# Patient Record
Sex: Male | Born: 1935 | Race: Asian | Hispanic: No | Marital: Married | Smoking: Never smoker
Health system: Southern US, Community
[De-identification: ages and names within clinical notes are randomized; demographics above are authoritative.]

## PROBLEM LIST (undated history)

## (undated) DIAGNOSIS — K219 Gastro-esophageal reflux disease without esophagitis: Secondary | ICD-10-CM

## (undated) DIAGNOSIS — Z860100 Personal history of colon polyps, unspecified: Secondary | ICD-10-CM

## (undated) DIAGNOSIS — I2510 Atherosclerotic heart disease of native coronary artery without angina pectoris: Secondary | ICD-10-CM

## (undated) DIAGNOSIS — N400 Enlarged prostate without lower urinary tract symptoms: Secondary | ICD-10-CM

## (undated) DIAGNOSIS — R350 Frequency of micturition: Secondary | ICD-10-CM

## (undated) DIAGNOSIS — E785 Hyperlipidemia, unspecified: Secondary | ICD-10-CM

## (undated) DIAGNOSIS — F419 Anxiety disorder, unspecified: Secondary | ICD-10-CM

## (undated) DIAGNOSIS — I4901 Ventricular fibrillation: Secondary | ICD-10-CM

## (undated) DIAGNOSIS — M1711 Unilateral primary osteoarthritis, right knee: Secondary | ICD-10-CM

## (undated) DIAGNOSIS — I10 Essential (primary) hypertension: Secondary | ICD-10-CM

## (undated) HISTORY — DX: Personal history of colonic polyps: Z86.010

## (undated) HISTORY — PX: CORONARY ANGIOPLASTY: SHX604

## (undated) HISTORY — DX: Ventricular fibrillation: I49.01

## (undated) HISTORY — DX: Hyperlipidemia, unspecified: E78.5

## (undated) HISTORY — DX: Atherosclerotic heart disease of native coronary artery without angina pectoris: I25.10

## (undated) HISTORY — DX: Benign prostatic hyperplasia without lower urinary tract symptoms: N40.0

## (undated) HISTORY — DX: Gastro-esophageal reflux disease without esophagitis: K21.9

## (undated) HISTORY — DX: Unilateral primary osteoarthritis, right knee: M17.11

## (undated) HISTORY — PX: FOOT SURGERY: SHX648

## (undated) HISTORY — DX: Personal history of colon polyps, unspecified: Z86.0100

---

## 1957-11-12 HISTORY — PX: OTHER SURGICAL HISTORY: SHX169

## 1999-11-13 HISTORY — PX: CORONARY ARTERY BYPASS GRAFT: SHX141

## 2000-05-31 ENCOUNTER — Ambulatory Visit (HOSPITAL_COMMUNITY): Admission: RE | Admit: 2000-05-31 | Discharge: 2000-05-31 | Payer: Self-pay | Admitting: Cardiovascular Disease

## 2000-06-12 ENCOUNTER — Encounter: Payer: Self-pay | Admitting: Surgery

## 2000-06-17 ENCOUNTER — Inpatient Hospital Stay (HOSPITAL_COMMUNITY): Admission: RE | Admit: 2000-06-17 | Discharge: 2000-06-22 | Payer: Self-pay | Admitting: Surgery

## 2000-06-17 ENCOUNTER — Encounter: Payer: Self-pay | Admitting: Surgery

## 2000-06-18 ENCOUNTER — Encounter: Payer: Self-pay | Admitting: Surgery

## 2000-06-19 ENCOUNTER — Encounter: Payer: Self-pay | Admitting: Surgery

## 2002-10-06 ENCOUNTER — Encounter: Payer: Self-pay | Admitting: Cardiovascular Disease

## 2004-05-31 ENCOUNTER — Inpatient Hospital Stay (HOSPITAL_COMMUNITY): Admission: AD | Admit: 2004-05-31 | Discharge: 2004-06-01 | Payer: Self-pay | Admitting: Cardiovascular Disease

## 2004-11-02 ENCOUNTER — Ambulatory Visit: Payer: Self-pay | Admitting: Internal Medicine

## 2004-11-14 ENCOUNTER — Ambulatory Visit: Payer: Self-pay | Admitting: Internal Medicine

## 2004-11-14 ENCOUNTER — Encounter: Admission: RE | Admit: 2004-11-14 | Discharge: 2004-11-14 | Payer: Self-pay | Admitting: Internal Medicine

## 2005-01-26 ENCOUNTER — Ambulatory Visit: Payer: Self-pay | Admitting: Internal Medicine

## 2005-02-02 ENCOUNTER — Ambulatory Visit: Payer: Self-pay | Admitting: Internal Medicine

## 2005-02-09 ENCOUNTER — Ambulatory Visit (HOSPITAL_COMMUNITY): Admission: RE | Admit: 2005-02-09 | Discharge: 2005-02-09 | Payer: Self-pay | Admitting: Internal Medicine

## 2005-06-26 ENCOUNTER — Ambulatory Visit: Payer: Self-pay | Admitting: Internal Medicine

## 2005-06-29 ENCOUNTER — Ambulatory Visit: Payer: Self-pay | Admitting: Internal Medicine

## 2005-09-13 ENCOUNTER — Ambulatory Visit: Payer: Self-pay | Admitting: Internal Medicine

## 2005-10-05 ENCOUNTER — Emergency Department (HOSPITAL_COMMUNITY): Admission: EM | Admit: 2005-10-05 | Discharge: 2005-10-05 | Payer: Self-pay | Admitting: Emergency Medicine

## 2005-10-12 ENCOUNTER — Ambulatory Visit: Payer: Self-pay | Admitting: Internal Medicine

## 2006-01-11 ENCOUNTER — Ambulatory Visit: Payer: Self-pay | Admitting: Internal Medicine

## 2006-01-18 ENCOUNTER — Ambulatory Visit: Payer: Self-pay | Admitting: Internal Medicine

## 2006-01-28 ENCOUNTER — Ambulatory Visit: Payer: Self-pay | Admitting: Internal Medicine

## 2006-02-04 ENCOUNTER — Ambulatory Visit: Payer: Self-pay | Admitting: Internal Medicine

## 2006-03-07 ENCOUNTER — Ambulatory Visit: Payer: Self-pay | Admitting: Internal Medicine

## 2006-03-07 ENCOUNTER — Encounter (INDEPENDENT_AMBULATORY_CARE_PROVIDER_SITE_OTHER): Payer: Self-pay | Admitting: *Deleted

## 2006-09-13 ENCOUNTER — Ambulatory Visit: Payer: Self-pay | Admitting: Internal Medicine

## 2007-01-23 ENCOUNTER — Ambulatory Visit: Payer: Self-pay | Admitting: Internal Medicine

## 2007-01-23 LAB — CONVERTED CEMR LAB
ALT: 19 units/L (ref 0–40)
Alkaline Phosphatase: 71 units/L (ref 39–117)
Basophils Absolute: 0 10*3/uL (ref 0.0–0.1)
Basophils Relative: 0.2 % (ref 0.0–1.0)
Bilirubin Urine: NEGATIVE
Bilirubin, Direct: 0.2 mg/dL (ref 0.0–0.3)
CO2: 28 meq/L (ref 19–32)
Cholesterol: 145 mg/dL (ref 0–200)
Creatinine, Ser: 1.1 mg/dL (ref 0.4–1.5)
Eosinophils Absolute: 0.2 10*3/uL (ref 0.0–0.6)
Eosinophils Relative: 1.6 % (ref 0.0–5.0)
GFR calc Af Amer: 85 mL/min
Glucose, Bld: 100 mg/dL — ABNORMAL HIGH (ref 70–99)
HDL: 55.7 mg/dL (ref >39.0)
Ketones, ur: NEGATIVE mg/dL
Leukocytes, UA: NEGATIVE
Lymphocytes Relative: 35 % (ref 12.0–46.0)
MCHC: 34 g/dL (ref 30.0–36.0)
MCV: 88.7 fL (ref 78.0–100.0)
Neutro Abs: 5.7 10*3/uL (ref 1.4–7.7)
Nitrite: NEGATIVE
PSA: 1.66 ng/mL
PSA: 1.66 ng/mL (ref 0.10–4.00)
Specific Gravity, Urine: 1.025 (ref 1.000–1.03)
TSH: 1.33 microintl units/mL (ref 0.35–5.50)
Total Bilirubin: 1 mg/dL (ref 0.3–1.2)
Total Protein, Urine: NEGATIVE mg/dL
Triglycerides: 63 mg/dL (ref 0–149)
pH: 5.5 (ref 5.0–8.0)

## 2007-04-23 ENCOUNTER — Ambulatory Visit: Payer: Self-pay | Admitting: Internal Medicine

## 2007-04-28 ENCOUNTER — Encounter: Payer: Self-pay | Admitting: Cardiovascular Disease

## 2007-05-13 HISTORY — PX: OTHER SURGICAL HISTORY: SHX169

## 2007-05-15 ENCOUNTER — Encounter: Payer: Self-pay | Admitting: Urology

## 2007-05-16 ENCOUNTER — Inpatient Hospital Stay (HOSPITAL_COMMUNITY): Admission: RE | Admit: 2007-05-16 | Discharge: 2007-05-17 | Payer: Self-pay | Admitting: Urology

## 2007-05-17 ENCOUNTER — Emergency Department (HOSPITAL_COMMUNITY): Admission: EM | Admit: 2007-05-17 | Discharge: 2007-05-17 | Payer: Self-pay | Admitting: Emergency Medicine

## 2007-08-04 ENCOUNTER — Encounter: Payer: Self-pay | Admitting: Internal Medicine

## 2007-08-04 DIAGNOSIS — K219 Gastro-esophageal reflux disease without esophagitis: Secondary | ICD-10-CM

## 2007-08-04 DIAGNOSIS — K4090 Unilateral inguinal hernia, without obstruction or gangrene, not specified as recurrent: Secondary | ICD-10-CM | POA: Insufficient documentation

## 2007-08-04 DIAGNOSIS — M1990 Unspecified osteoarthritis, unspecified site: Secondary | ICD-10-CM | POA: Insufficient documentation

## 2007-09-10 ENCOUNTER — Ambulatory Visit: Payer: Self-pay | Admitting: Internal Medicine

## 2007-09-12 ENCOUNTER — Encounter: Payer: Self-pay | Admitting: Internal Medicine

## 2008-03-15 ENCOUNTER — Ambulatory Visit: Payer: Self-pay | Admitting: Internal Medicine

## 2008-03-15 LAB — CONVERTED CEMR LAB
AST: 22 units/L (ref 0–37)
Albumin: 3.7 g/dL (ref 3.5–5.2)
Alkaline Phosphatase: 67 units/L (ref 39–117)
BUN: 20 mg/dL (ref 6–23)
Basophils Absolute: 0 10*3/uL (ref 0.0–0.1)
Bilirubin Urine: NEGATIVE
Bilirubin, Direct: 0.1 mg/dL (ref 0.0–0.3)
CO2: 27 meq/L (ref 19–32)
Calcium: 9.2 mg/dL (ref 8.4–10.5)
Cholesterol: 194 mg/dL (ref 0–200)
Creatinine, Ser: 1.1 mg/dL (ref 0.4–1.5)
GFR calc non Af Amer: 70 mL/min
Glucose, Bld: 100 mg/dL — ABNORMAL HIGH (ref 70–99)
HDL: 52.3 mg/dL (ref >39.0)
Hemoglobin, Urine: NEGATIVE
Hemoglobin: 13.5 g/dL (ref 13.0–17.0)
Leukocytes, UA: NEGATIVE
MCHC: 33.8 g/dL (ref 30.0–36.0)
Neutro Abs: 4.2 10*3/uL (ref 1.4–7.7)
Nitrite: NEGATIVE
PSA: 0.92 ng/mL (ref 0.10–4.00)
Platelets: 180 10*3/uL (ref 150–400)
Potassium: 4 meq/L (ref 3.5–5.1)
RBC: 4.46 M/uL (ref 4.22–5.81)
RDW: 13.5 % (ref 11.5–14.6)
Sodium: 144 meq/L (ref 135–145)
Total Bilirubin: 0.9 mg/dL (ref 0.3–1.2)
Total Protein, Urine: NEGATIVE mg/dL
WBC: 8.5 10*3/uL (ref 4.5–10.5)

## 2008-03-24 ENCOUNTER — Ambulatory Visit: Payer: Self-pay | Admitting: Internal Medicine

## 2008-03-24 DIAGNOSIS — Z860100 Personal history of colon polyps, unspecified: Secondary | ICD-10-CM | POA: Insufficient documentation

## 2008-03-24 DIAGNOSIS — N400 Enlarged prostate without lower urinary tract symptoms: Secondary | ICD-10-CM

## 2008-03-24 DIAGNOSIS — E785 Hyperlipidemia, unspecified: Secondary | ICD-10-CM

## 2008-04-12 DIAGNOSIS — I4901 Ventricular fibrillation: Secondary | ICD-10-CM

## 2008-04-12 HISTORY — DX: Ventricular fibrillation: I49.01

## 2008-04-15 ENCOUNTER — Inpatient Hospital Stay (HOSPITAL_COMMUNITY): Admission: EM | Admit: 2008-04-15 | Discharge: 2008-04-20 | Payer: Self-pay | Admitting: Emergency Medicine

## 2008-04-15 ENCOUNTER — Ambulatory Visit: Payer: Self-pay | Admitting: Internal Medicine

## 2008-05-05 ENCOUNTER — Ambulatory Visit: Payer: Self-pay

## 2008-07-20 ENCOUNTER — Ambulatory Visit: Payer: Self-pay | Admitting: Internal Medicine

## 2008-08-21 IMAGING — CR DG CHEST 2V
2 series · 2 of 2 positions shown · non-contrast
Comparison: 01/28/2006.

Exam: Chest, 2 views.

HISTORY: Preop radiograph. BPH.

[w chest pa]
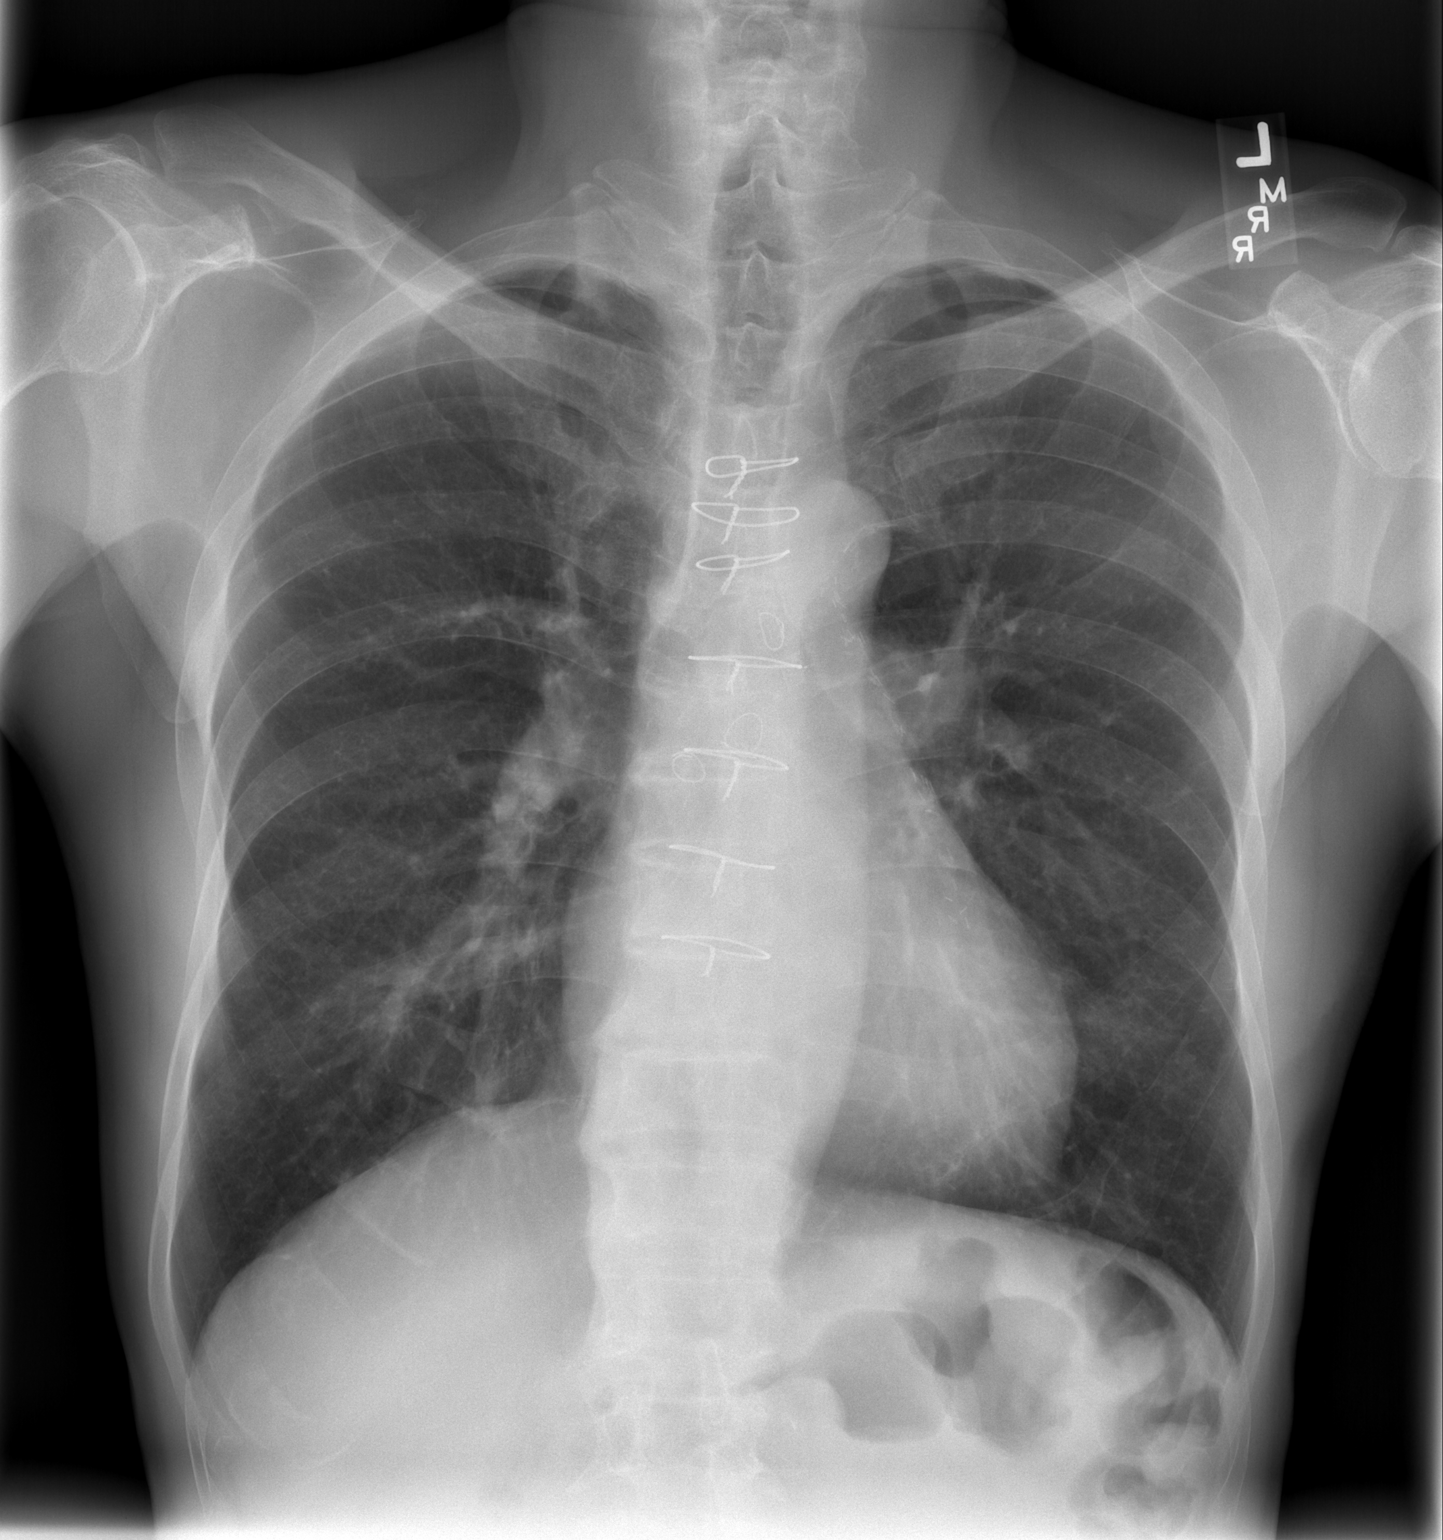

[w chest lat]
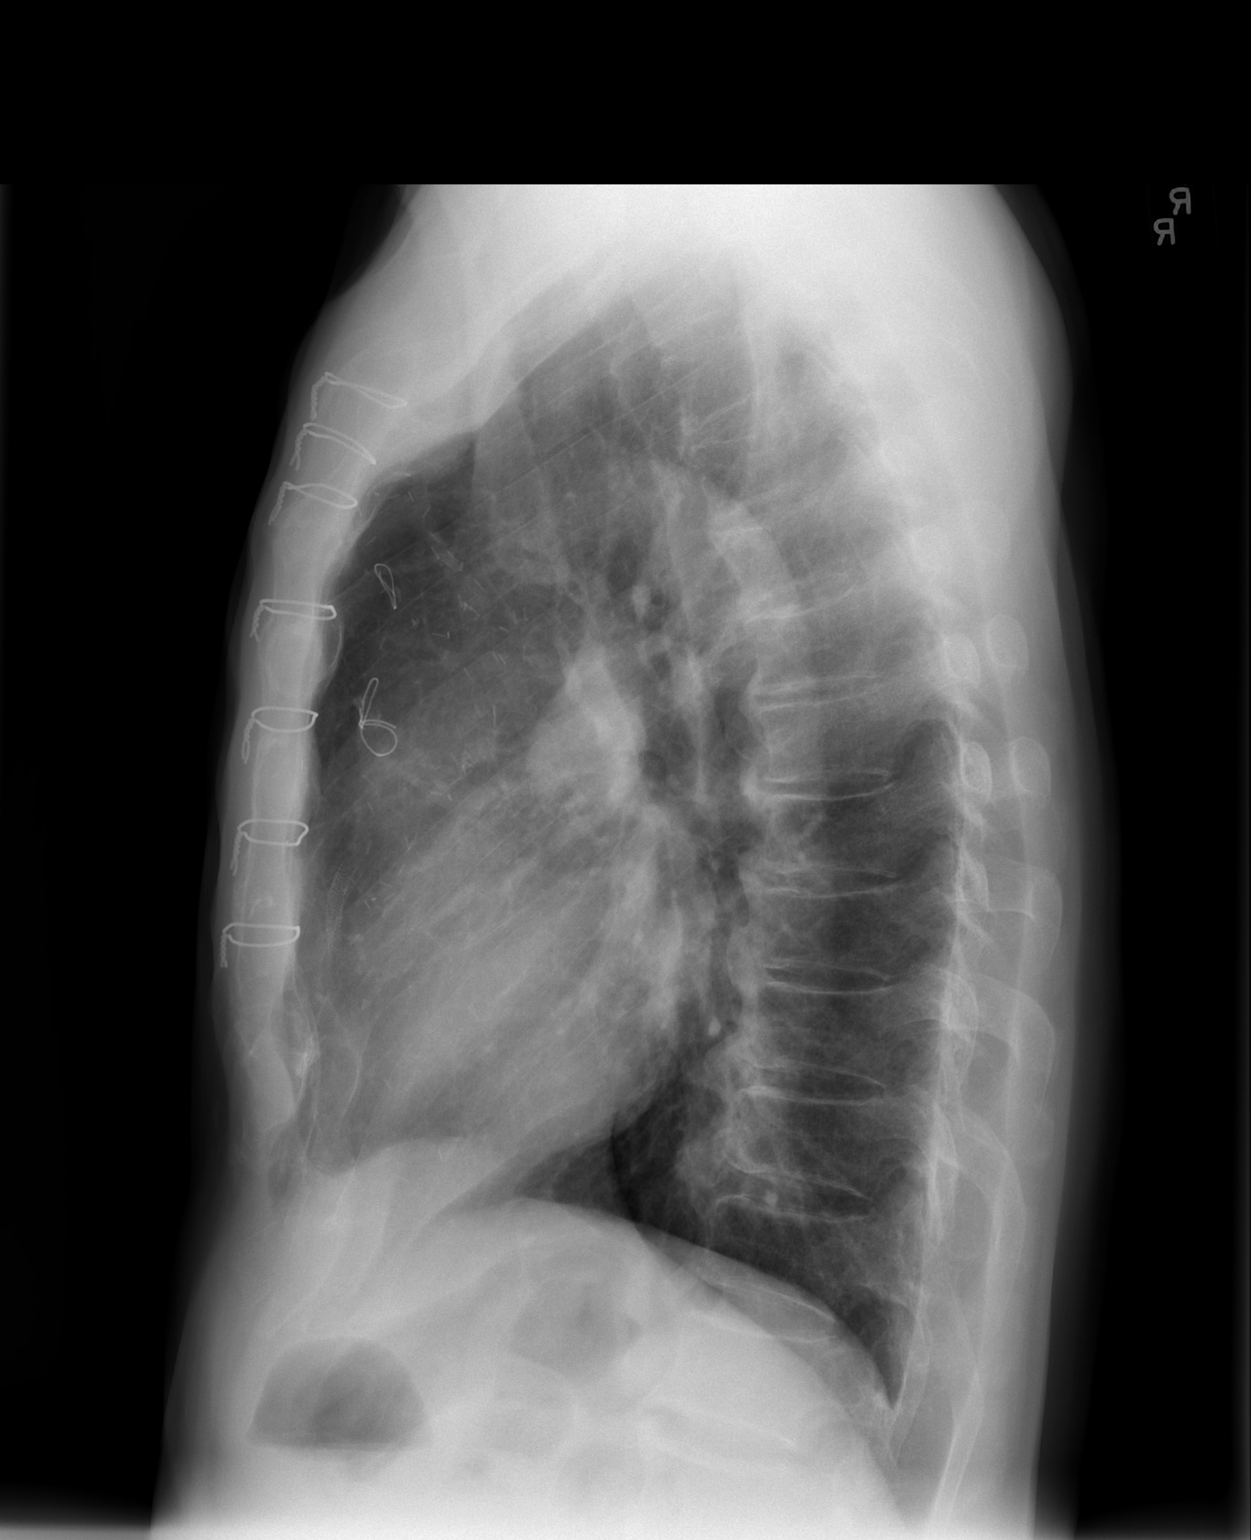

[2 of 2 positions shown; findings below may reference images not displayed]

FINDINGS: Heart size is normal. 

There is no effusions or edema.

Interstitial change consistent with COPD/emphysema.
IMPRESSION: 1. No active disease.
2. COPD/emphysema.

## 2008-08-24 ENCOUNTER — Ambulatory Visit: Payer: Self-pay | Admitting: Internal Medicine

## 2008-10-19 ENCOUNTER — Ambulatory Visit: Payer: Self-pay | Admitting: Internal Medicine

## 2009-01-18 ENCOUNTER — Ambulatory Visit: Payer: Self-pay | Admitting: Internal Medicine

## 2009-02-08 ENCOUNTER — Encounter: Payer: Self-pay | Admitting: Internal Medicine

## 2009-03-14 ENCOUNTER — Ambulatory Visit: Payer: Self-pay | Admitting: Internal Medicine

## 2009-03-14 LAB — CONVERTED CEMR LAB
ALT: 18 units/L (ref 0–53)
AST: 20 units/L (ref 0–37)
Albumin: 3.9 g/dL (ref 3.5–5.2)
Alkaline Phosphatase: 63 units/L (ref 39–117)
BUN: 12 mg/dL (ref 6–23)
Basophils Absolute: 0 10*3/uL (ref 0.0–0.1)
Basophils Relative: 0.3 % (ref 0.0–3.0)
Bilirubin Urine: NEGATIVE
Bilirubin, Direct: 0.2 mg/dL (ref 0.0–0.3)
CO2: 29 meq/L (ref 19–32)
Calcium: 9.3 mg/dL (ref 8.4–10.5)
Chloride: 109 meq/L (ref 96–112)
Cholesterol: 125 mg/dL (ref 0–200)
Creatinine, Ser: 1.1 mg/dL (ref 0.4–1.5)
Eosinophils Absolute: 0.2 10*3/uL (ref 0.0–0.7)
Eosinophils Relative: 2.4 % (ref 0.0–5.0)
GFR calc non Af Amer: 69.97 mL/min (ref >60)
Glucose, Bld: 90 mg/dL (ref 70–99)
HCT: 42.3 % (ref 39.0–52.0)
HDL: 42.9 mg/dL (ref >39.00)
Hemoglobin: 14.4 g/dL (ref 13.0–17.0)
Ketones, ur: NEGATIVE mg/dL
LDL Cholesterol: 67 mg/dL (ref 0–99)
Leukocytes, UA: NEGATIVE
Lymphocytes Relative: 35.3 % (ref 12.0–46.0)
Lymphs Abs: 3.1 10*3/uL (ref 0.7–4.0)
MCHC: 34.1 g/dL (ref 30.0–36.0)
MCV: 89.4 fL (ref 78.0–100.0)
Monocytes Absolute: 0.7 10*3/uL (ref 0.1–1.0)
Monocytes Relative: 7.9 % (ref 3.0–12.0)
Neutro Abs: 4.7 10*3/uL (ref 1.4–7.7)
Neutrophils Relative %: 54.1 % (ref 43.0–77.0)
Nitrite: NEGATIVE
PSA: 1.3 ng/mL (ref 0.10–4.00)
Platelets: 137 10*3/uL — ABNORMAL LOW (ref 150.0–400.0)
Potassium: 4.3 meq/L (ref 3.5–5.1)
RBC: 4.73 M/uL (ref 4.22–5.81)
RDW: 13 % (ref 11.5–14.6)
Sodium: 142 meq/L (ref 135–145)
Specific Gravity, Urine: 1.02 (ref 1.000–1.030)
TSH: 1.76 microintl units/mL (ref 0.35–5.50)
Total Bilirubin: 1.1 mg/dL (ref 0.3–1.2)
Total CHOL/HDL Ratio: 3
Total Protein, Urine: NEGATIVE mg/dL
Total Protein: 7.4 g/dL (ref 6.0–8.3)
Triglycerides: 74 mg/dL (ref 0.0–149.0)
Urine Glucose: NEGATIVE mg/dL
Urobilinogen, UA: 0.2 (ref 0.0–1.0)
VLDL: 14.8 mg/dL (ref 0.0–40.0)
WBC: 8.7 10*3/uL (ref 4.5–10.5)
pH: 5.5 (ref 5.0–8.0)

## 2009-03-25 ENCOUNTER — Ambulatory Visit: Payer: Self-pay | Admitting: Internal Medicine

## 2009-04-05 ENCOUNTER — Ambulatory Visit: Payer: Self-pay | Admitting: Internal Medicine

## 2009-04-05 DIAGNOSIS — I4901 Ventricular fibrillation: Secondary | ICD-10-CM

## 2009-05-07 ENCOUNTER — Encounter: Payer: Self-pay | Admitting: Internal Medicine

## 2009-06-15 ENCOUNTER — Ambulatory Visit: Payer: Self-pay | Admitting: Internal Medicine

## 2009-07-05 ENCOUNTER — Encounter: Payer: Self-pay | Admitting: Internal Medicine

## 2009-07-05 ENCOUNTER — Ambulatory Visit: Payer: Self-pay | Admitting: Internal Medicine

## 2009-07-20 ENCOUNTER — Encounter: Payer: Self-pay | Admitting: Internal Medicine

## 2009-07-24 IMAGING — CR DG CHEST 1V PORT
1 series · 1 of 1 positions shown · non-contrast
Comparison: 05/14/2007

CLINICAL DATA: Chest pain

PORTABLE CHEST - 1 VIEW

[AP]
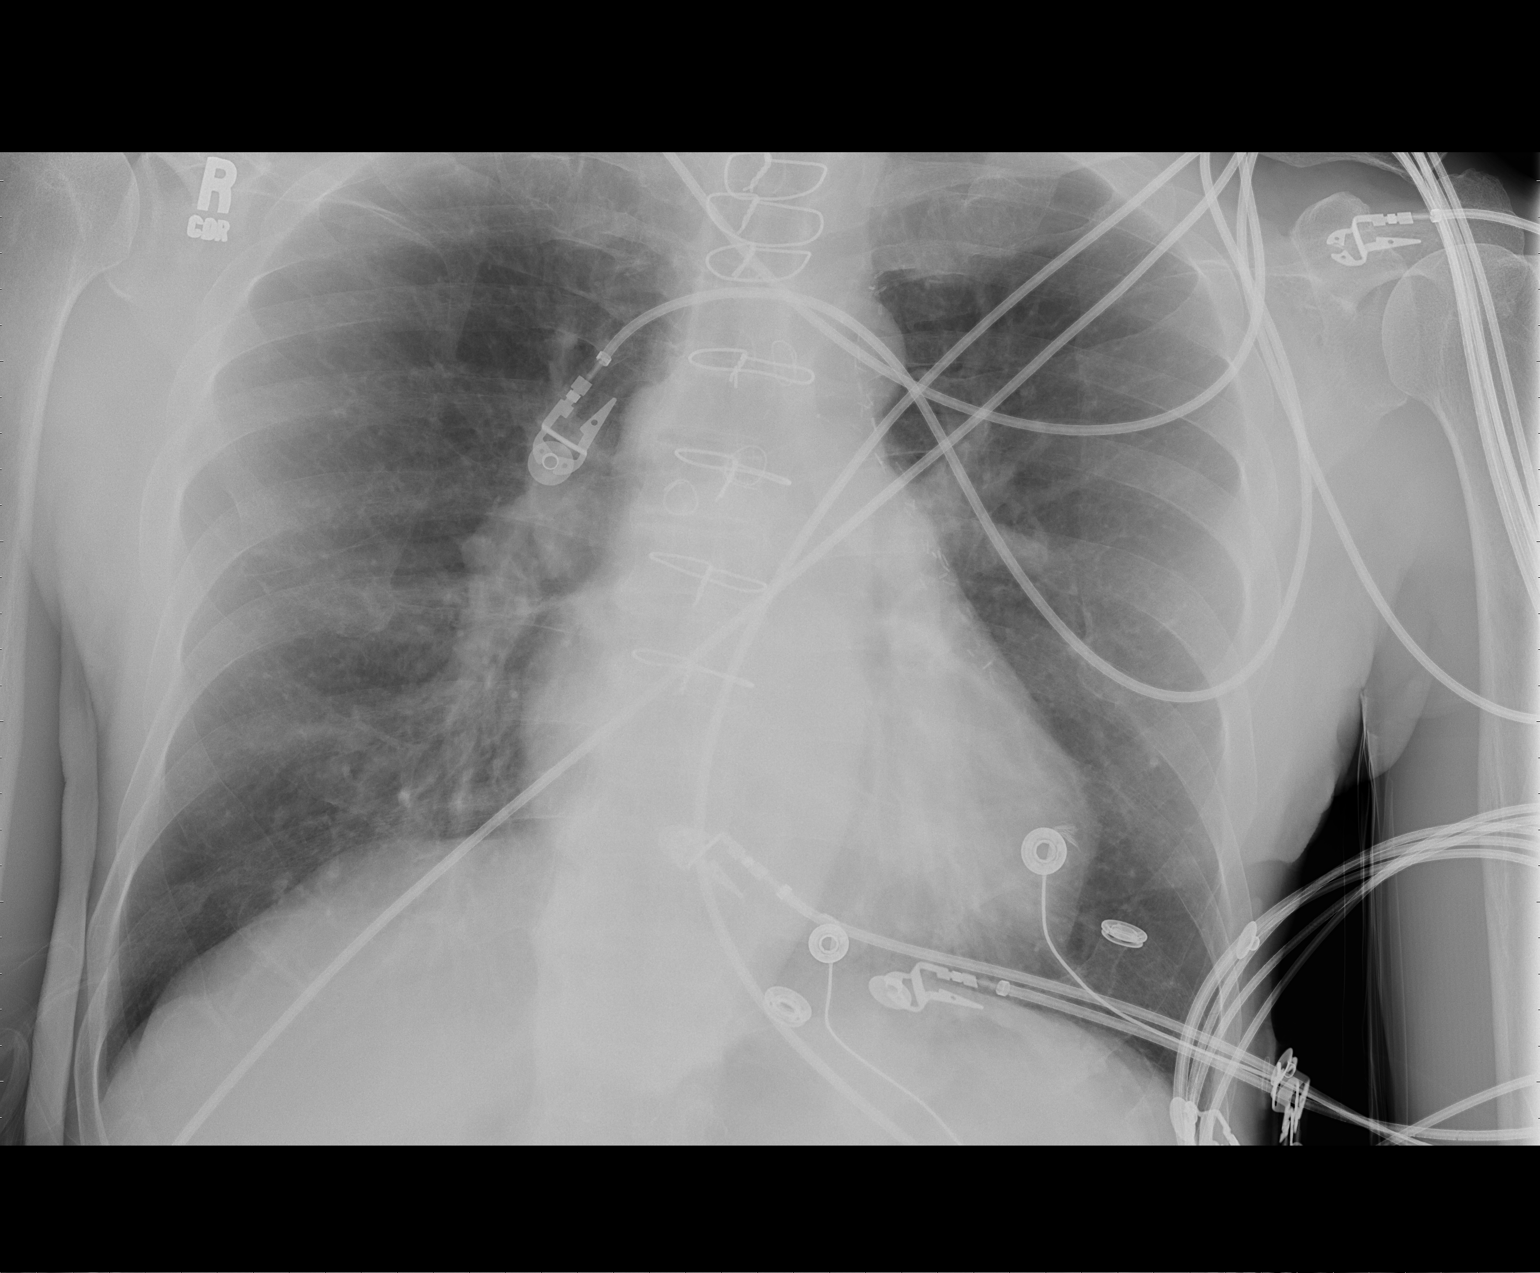

[1 of 1 positions shown; findings below may reference images not displayed]

FINDINGS: COPD with hyperinflation.  Cardiomegaly status post CABG.
No infiltrates, failure, or pneumothorax.  Little change from
priors.
IMPRESSION: COPD, no active disease, and no significant change.

## 2009-08-11 ENCOUNTER — Encounter: Payer: Self-pay | Admitting: Internal Medicine

## 2009-09-12 ENCOUNTER — Telehealth: Payer: Self-pay | Admitting: Internal Medicine

## 2009-09-21 ENCOUNTER — Ambulatory Visit: Payer: Self-pay | Admitting: Internal Medicine

## 2009-09-21 DIAGNOSIS — K4021 Bilateral inguinal hernia, without obstruction or gangrene, recurrent: Secondary | ICD-10-CM | POA: Insufficient documentation

## 2009-09-21 DIAGNOSIS — R109 Unspecified abdominal pain: Secondary | ICD-10-CM | POA: Insufficient documentation

## 2009-09-21 LAB — CONVERTED CEMR LAB
Nitrite: NEGATIVE
Protein, U semiquant: NEGATIVE
pH: 5

## 2009-10-04 ENCOUNTER — Telehealth (INDEPENDENT_AMBULATORY_CARE_PROVIDER_SITE_OTHER): Payer: Self-pay | Admitting: *Deleted

## 2009-10-04 ENCOUNTER — Ambulatory Visit: Payer: Self-pay | Admitting: Internal Medicine

## 2009-10-04 DIAGNOSIS — I4949 Other premature depolarization: Secondary | ICD-10-CM

## 2009-10-05 ENCOUNTER — Telehealth (INDEPENDENT_AMBULATORY_CARE_PROVIDER_SITE_OTHER): Payer: Self-pay | Admitting: *Deleted

## 2009-10-10 ENCOUNTER — Telehealth (INDEPENDENT_AMBULATORY_CARE_PROVIDER_SITE_OTHER): Payer: Self-pay | Admitting: *Deleted

## 2009-10-12 ENCOUNTER — Telehealth: Payer: Self-pay | Admitting: Internal Medicine

## 2009-10-18 ENCOUNTER — Telehealth: Payer: Self-pay | Admitting: Internal Medicine

## 2009-10-19 ENCOUNTER — Encounter: Payer: Self-pay | Admitting: Internal Medicine

## 2009-11-15 ENCOUNTER — Ambulatory Visit: Payer: Self-pay | Admitting: Cardiology

## 2009-11-15 DIAGNOSIS — I429 Cardiomyopathy, unspecified: Secondary | ICD-10-CM | POA: Insufficient documentation

## 2009-11-15 DIAGNOSIS — I2589 Other forms of chronic ischemic heart disease: Secondary | ICD-10-CM

## 2009-11-17 ENCOUNTER — Encounter (INDEPENDENT_AMBULATORY_CARE_PROVIDER_SITE_OTHER): Payer: Self-pay | Admitting: *Deleted

## 2009-11-17 ENCOUNTER — Ambulatory Visit: Payer: Self-pay | Admitting: Cardiology

## 2009-11-17 LAB — CONVERTED CEMR LAB
ALT: 19 units/L (ref 0–53)
AST: 21 units/L (ref 0–37)
Albumin: 3.6 g/dL (ref 3.5–5.2)
Alkaline Phosphatase: 59 units/L (ref 39–117)
Bilirubin, Direct: 0.1 mg/dL (ref 0.0–0.3)
Cholesterol: 147 mg/dL (ref 0–200)
LDL Cholesterol: 83 mg/dL (ref 0–99)
Total CHOL/HDL Ratio: 3
Total Protein: 7.2 g/dL (ref 6.0–8.3)
Triglycerides: 97 mg/dL (ref 0.0–149.0)
VLDL: 19.4 mg/dL (ref 0.0–40.0)

## 2009-11-21 ENCOUNTER — Encounter: Payer: Self-pay | Admitting: Cardiology

## 2009-11-21 ENCOUNTER — Ambulatory Visit: Payer: Self-pay | Admitting: Cardiology

## 2009-11-21 ENCOUNTER — Ambulatory Visit (HOSPITAL_COMMUNITY): Admission: RE | Admit: 2009-11-21 | Discharge: 2009-11-21 | Payer: Self-pay | Admitting: Cardiology

## 2009-11-21 ENCOUNTER — Ambulatory Visit: Payer: Self-pay

## 2009-11-22 ENCOUNTER — Ambulatory Visit (HOSPITAL_COMMUNITY): Admission: RE | Admit: 2009-11-22 | Discharge: 2009-11-23 | Payer: Self-pay | Admitting: Surgery

## 2009-11-25 ENCOUNTER — Encounter: Payer: Self-pay | Admitting: Internal Medicine

## 2009-12-12 ENCOUNTER — Encounter: Payer: Self-pay | Admitting: Cardiology

## 2009-12-13 ENCOUNTER — Ambulatory Visit: Payer: Self-pay | Admitting: Cardiology

## 2009-12-14 ENCOUNTER — Telehealth (INDEPENDENT_AMBULATORY_CARE_PROVIDER_SITE_OTHER): Payer: Self-pay | Admitting: *Deleted

## 2010-01-03 ENCOUNTER — Ambulatory Visit: Payer: Self-pay | Admitting: Internal Medicine

## 2010-01-03 ENCOUNTER — Encounter: Payer: Self-pay | Admitting: Internal Medicine

## 2010-01-06 ENCOUNTER — Encounter: Payer: Self-pay | Admitting: Internal Medicine

## 2010-01-16 ENCOUNTER — Encounter: Payer: Self-pay | Admitting: Internal Medicine

## 2010-03-28 ENCOUNTER — Telehealth: Payer: Self-pay | Admitting: Cardiology

## 2010-03-28 ENCOUNTER — Encounter: Payer: Self-pay | Admitting: Internal Medicine

## 2010-03-28 ENCOUNTER — Telehealth (INDEPENDENT_AMBULATORY_CARE_PROVIDER_SITE_OTHER): Payer: Self-pay | Admitting: *Deleted

## 2010-12-10 LAB — CONVERTED CEMR LAB
Creatinine, Ser: 1.2 mg/dL (ref 0.4–1.5)
GFR calc non Af Amer: 63.19 mL/min (ref >60)
Glucose, Bld: 111 mg/dL — ABNORMAL HIGH (ref 70–99)

## 2010-12-12 NOTE — Letter (Signed)
Summary: [facility]   Imported By: Sherian Rein 01/13/2010 12:26:34  _____________________________________________________________________  External Attachment:    Type:   Image     Comment:   External Document

## 2010-12-12 NOTE — Letter (Signed)
Summary: Remote Device Check  [employer], Main Office  [address]   [place], [place] [address]   Phone: [phone]  Fax: [phone]     January 16, 2010 MRN: [id number]   LIDO MASKE [address] [place], [place]  [address]   Dear Mr. RODRIGUE,   Your remote transmission was recieved and reviewed by your physician.  All diagnostics were within normal limits for you.  __X___Your next transmission is scheduled for:      Apr 04, 2010.  Please transmit at any time this day.  If you have a wireless device your transmission will be sent automatically.     Sincerely,  Proofreader

## 2010-12-12 NOTE — Miscellaneous (Signed)
Summary: DEVICE  UPDATE  Clinical Lists Changes  Observations: Added new observation of [employer]: [employer], 03-28-10 PER DAUGHTER- PT MOVED OUT OF THE COUNTRY (03/28/2010 9:58) Added new observation of ICD MD: Inactive (03/28/2010 9:58)       ICD Specifications Following MD:  Inactive     ICD Vendor:  Medtronic     ICD Model Number:  D284VRC     ICD Serial Number:  [id number] ICD DOI:  04/19/2008     ICD Implanting MD:  [name], MD  Lead 1:    Location: RV     DOI: 04/19/2008     Model #: [id number]     Serial #: [id number]     Status: active  Indications::  ICM  Explantation Comments: [employer], 03-28-10 PER DAUGHTER- PT MOVED OUT OF THE COUNTRY  ICD Follow Up ICD Dependent:  No      Episodes Coumadin:  No  Brady Parameters Mode VVI     Lower Rate Limit:  40      Tachy Zones VF:  200     VT1:  171

## 2010-12-12 NOTE — Progress Notes (Signed)
 ----   Converted from flag ---- ---- 03/28/2010 9:49 AM, Judie Grieve wrote: This pt has moved out of the country per pt daughter Mauri [place] ------------------------------

## 2010-12-12 NOTE — Letter (Signed)
Summary: [facility]   Imported By: Kassie Mends 11/15/2009 11:46:34  _____________________________________________________________________  External Attachment:    Type:   Image     Comment:   External Document

## 2010-12-12 NOTE — Assessment & Plan Note (Signed)
Summary: PER DR.KLEIN/DM  Medications Added CRESTOR 10 MG  TABS (ROSUVASTATIN CALCIUM) 1 by mouth once daily COREG 6.25 MG TABS (CARVEDILOL) one tablet twice a day      Allergies Added: NKDA  Visit Type:  Initial Consult Primary Provider:  Corwin Levins MD   History of Present Illness: [name] with history of CAD s/p CABG and ventricular fibrillation s/p ICD placement presents to establish cardiology followup.  [name] has been seen by Dr. Elease Hashimoto in the past but cannot continue in that office because of his insurance.  [name] had a ventricular fibrillation arrest in 6/09.  LHC showed patent grafts.  The event may have been scar-related as there was no evidence for an acute ischemic event.  Patient got an ICD at that time, followed by Dr. Graciela Husbands.  [name] has been doing well since then.  No chest pain.  [name] has good exercise tolerance with no particular DOE.  [name] is able to climb steps. No palpitations or syncope.  [name] is going to have an inguinal hernia repair next week.  [name] has already been cleared for this by Dr. Graciela Husbands. BP is a little high today but usually runs in the 130s/70s at home.   Labs (11/10): creatinine 1.2  Current Medications (verified): 1)  Nexium 40 Mg Cpdr (Esomeprazole Magnesium) .Marland Kitchen.. 1 By Mouth Once Daily, As Needed 2)  Crestor 10 Mg  Tabs (Rosuvastatin Calcium) .Marland Kitchen.. 1 By Mouth Once Daily 3)  Ecotrin Low Strength 81 Mg  Tbec (Aspirin) .Marland Kitchen.. 1 By Mouth Qd 4)  Tylenol 325 Mg Tabs (Acetaminophen) .... As Needed 5)  Coreg 6.25 Mg Tabs (Carvedilol) .... One Tablet Twice A Day 6)  Nitroglycerin 0.4 Mg Subl (Nitroglycerin) .Marland Kitchen.. 1 Sl As Needed  Allergies (verified): No Known Drug Allergies  Past History:  Past Medical History: 1. CAD: CABG 2001.  Last LHC in 6/09 showed TO LAD, TO mRCA, TO CFX.  SVG-RCA patent, SVG-D patent, SVG-OM2 patent, LIMA-LAD patent.  EF 40-45%.   2.  Ventricular fibrillation in 6/09: ? scar related as no acute ischemia.  Patient has Medtronic ICD.  3.  Medtronic  Maximo II VR D284VRC 4.  GERD 5.  Hyperlipidemia 6.  Benign prostatic hypertrophy 7.  pulm nodule - stable from 1/06 Brent.  right knee DJD 9.  Colonic polyps, hx of 10. Inguinal hernia 11. Mild ischemic CMP: EF 40-45% by LV-gram 6/09  Family History: Reviewed history from 03/24/2008 and no changes required. 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   Vital Signs:  Patient profile:   Brent year old male Height:      62 inches Weight:      134 pounds Pulse rate:   62 / minute BP sitting:   145 / 83  (left arm)  Vitals Entered By: Oswald Hillock (November 15, 2009 3:25 PM)  Physical Exam  General:  Well developed, well nourished, in no acute distress. Head:  normocephalic and atraumatic Nose:  no deformity, discharge, inflammation, or lesions Mouth:  Teeth, gums and palate normal. Oral mucosa normal. Neck:  Neck supple, no JVD. No masses, thyromegaly or abnormal cervical nodes. Lungs:  Clear bilaterally to auscultation and percussion. Heart:  Non-displaced PMI, chest non-tender; regular rate and  rhythm, S1, S2 without murmurs, rubs.  +S4. Carotid upstroke normal, no bruit. Weak pedal pulses. No edema, no varicosities. Abdomen:  Bowel sounds positive; abdomen soft and non-tender without masses, organomegaly, or hernias noted. No hepatosplenomegaly. Msk:  Back normal, normal gait. Muscle strength and tone normal. Extremities:  No clubbing or cyanosis. Neurologic:  Alert and oriented x 3. Skin:  Intact without lesions or rashes. Psych:  Normal affect.    ICD Specifications Following MD:  [name], MD     ICD Vendor:  Medtronic     ICD Model Number:  D284VRC     ICD Serial Number:  [id number] ICD DOI:  04/19/2008     ICD  Implanting MD:  [name], MD  Lead 1:    Location: RV     DOI: 04/19/2008     Model #: [id number]     Serial #: [id number]     Status: active  Indications::  ICM  Explantation Comments: [facility]  ICD Follow Up ICD Dependent:  No      Episodes Coumadin:  No  Brady Parameters Mode VVI     Lower Rate Limit:  40      Tachy Zones VF:  200     VT1:  171     Impression & Recommendations:  Problem # 1:  CORONARY ARTERY DISEASE; S/P CABG-EF 45% (ICD-414.00) Stable.  No ischemic symptoms.  Grafts patent in 6/09.  Continue ASA and statin.  Will change metoprolol to Coreg.   Problem # 2:  CARDIOMYOPATHY, ISCHEMIC (ICD-414.Brent) EF 40-45% by LV-gram in 6/09.  [name] appears euvolemic and does not have significant exertional dyspnea.  Given decreased EF, change metoprolol to Coreg 6.25 mg two times a day.  I will get an echo.  [name] will followup in 1 month.  I will likely start enalapril at that time.    Problem # 3:  VENTRICULAR FIBRILLATION (ICD-427.41) May have been due to scarring.  [name] has an ICD.   Problem # 4:  INGUINAL HERNIA, RECURRENT (ICD-550.93) Has been okay'd for surgery by Dr. Graciela Husbands already.  [name] has good exercise capacity and should continue his beta blocker peri-operatively.  No indication for further cardiac testing.   Problem # 5:  HYPERLIPIDEMIA (ICD-272.4) Check lipids/LFTs this week.   Other Orders: Echocardiogram (Echo)  Patient Instructions: 1)  Your physician has recommended you make the following change in your medication:  2)  Stop Metoprolol 3)  Start Coreg 6.25mg  twice a day 4)  Your physician recommends that you return for a FASTING lipid profile/liver/bnp this week 414.00 272.0 v58.69 5)  Your physician recommends that you schedule a follow-up appointment in: 1 month with Dr. Marca Ancona  6)  Your physician has requested that you have an echocardiogram.  Echocardiography is a painless test that uses sound waves to create images of your heart. It provides your doctor  with information about the size and shape of your heart and how well your heart's chambers and valves are working.  This procedure takes approximately one hour. There are no restrictions for this procedure. Prescriptions: CRESTOR 10 MG  TABS (ROSUVASTATIN CALCIUM) 1 by mouth once daily  #30 x 3   Entered by:   Katina Dung, RN, BSN   Authorized by:   Marca Ancona, MD   Signed by:   Katina Dung, RN, BSN on 11/15/2009   Method used:   Electronically to        [facility] [phone][facility] (retail)  [address]       [place], [place]  [address]       Ph: [phone] or [phone]       Fax: [phone]   RxID:   [id number] COREG 6.25 MG TABS (CARVEDILOL) one tablet twice a day  #60 x 6   Entered by:   Katina Dung, RN, BSN   Authorized by:   Marca Ancona, MD   Signed by:   Katina Dung, RN, BSN on 11/15/2009   Method used:   Electronically to        [facility] [phone]* (retail)       [address]       [place], [place]  [address]       Ph: [phone] or [phone]       Fax: [phone]   RxID:   [id number]

## 2010-12-12 NOTE — Letter (Signed)
Summary: [facility]   Imported By: Lester [place] 12/06/2009 13:55:25  _____________________________________________________________________  External Attachment:    Type:   Image     Comment:   External Document

## 2010-12-12 NOTE — Cardiovascular Report (Signed)
Summary: Office Visit Remote   Office Visit Remote   Imported By: Roderic Ovens 01/18/2010 11:08:08  _____________________________________________________________________  External Attachment:    Type:   Image     Comment:   External Document

## 2010-12-12 NOTE — Miscellaneous (Signed)
  Clinical Lists Changes  Observations: Added new observation of CXR RESULTS:  CHEST - 2 VIEW    Comparison: Chest x-ray of 04/20/2008    Findings: The lungs remain clear.  Mild cardiomegaly is stable.   Pacer with AICD lead remains.  Median sternotomy sutures are noted   from prior CABG.  There are degenerative changes throughout the   thoracic spine.    IMPRESSION:   Stable chest x-ray.  No active lung disease.  Pacer with AICD lead   remains.    Read By:  Juline Patch,  M.D.   Released By:  Juline Patch,  M.D.  (11/17/2009 9:25)      CXR  Procedure date:  11/17/2009  Findings:       CHEST - 2 VIEW    Comparison: Chest x-ray of 04/20/2008    Findings: The lungs remain clear.  Mild cardiomegaly is stable.   Pacer with AICD lead remains.  Median sternotomy sutures are noted   from prior CABG.  There are degenerative changes throughout the   thoracic spine.    IMPRESSION:   Stable chest x-ray.  No active lung disease.  Pacer with AICD lead   remains.    Read By:  Juline Patch,  M.D.   Released By:  Juline Patch,  M.D.

## 2010-12-12 NOTE — Consult Note (Signed)
Summary: [facility]   Imported By: Sherian Rein 01/18/2010 11:36:20  _____________________________________________________________________  External Attachment:    Type:   Image     Comment:   External Document

## 2010-12-12 NOTE — Assessment & Plan Note (Signed)
Summary: per check out/sf  Medications Added CRESTOR 20 MG TABS (ROSUVASTATIN CALCIUM) one tablet in the evening COREG 6.25 MG TABS (CARVEDILOL) one  tablet twice a day      Allergies Added: NKDA  Visit Type:  rov Referring Provider:  Delane Ginger Primary Provider:  Corwin Levins MD  CC:  no cardaic complaints today.  History of Present Illness: 76 yo with history of CAD s/p CABG and ventricular fibrillation s/p ICD placement presents for cardiology followup. He had an uneventful inguinal hernia repair recently.  At last appointment, I increased his Crestor to 20 mg daily and changed his metoprolol to Coreg given LV systolic dysfunction.  He felt like the Coreg made him feel "funny," so he decreased it to 3.125 mg two times a day.  His BP has been controlled, it is 138/70 today.  He denies exertional shortness of breath or chest pain.  He and his wife may move back to the [place] this spring.  Finally, we did an echo in 1/11 that showed EF 50-55% (improved) with no regional wall motion abnormalities.    Labs (11/10): creatinine 1.2 Labs (1/11): HDL 45, LDL 83  Current Medications (verified): 1)  Nexium 40 Mg Cpdr (Esomeprazole Magnesium) .Marland Kitchen.. 1 By Mouth Once Daily, As Needed 2)  Crestor 10 Mg  Tabs (Rosuvastatin Calcium) .Marland Kitchen.. 1 By Mouth Twice Daily 3)  Ecotrin Low Strength 81 Mg  Tbec (Aspirin) .Marland Kitchen.. 1 By Mouth Qd 4)  Tylenol 325 Mg Tabs (Acetaminophen) .... As Needed 5)  Coreg 6.25 Mg Tabs (Carvedilol) .... One  Half Tablet Twice A Day 6)  Nitroglycerin 0.4 Mg Subl (Nitroglycerin) .Marland Kitchen.. 1 Sl As Needed  Allergies (verified): No Known Drug Allergies  Past History:  Past Medical History: 1. CAD: CABG 2001.  Last LHC in 6/09 showed TO LAD, TO mRCA, TO CFX.  SVG-RCA patent, SVG-D patent, SVG-OM2 patent, LIMA-LAD patent.  EF 40-45%.   2.  Ventricular fibrillation in 6/09: ? scar related as no acute ischemia.  Patient has Medtronic ICD.  3.  Medtronic Maximo II VR D284VRC 4.   GERD 5.  Hyperlipidemia 6.  Benign prostatic hypertrophy 7.  pulm nodule - stable from 1/06 8.  right knee DJD 9.  Colonic polyps, hx of 10. Inguinal hernia 11. Mild ischemic CMP: EF 40-45% by LV-gram 6/09.  Repeat echo (1/11) with EF 50-55%, no regional wall motion abnormalities, grade I diastolic dysfunction, mild AI, mild MR.   Family History: Reviewed history from 03/24/2008 and no changes required. sister died at 36 yo  - old age, back problems  Social History: Reviewed history from 11/15/2009 and no changes required. 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 Neck:  Neck supple, no JVD. No masses, thyromegaly or abnormal cervical nodes. Lungs:  Slight dry crackles at the bases bilaterally.  Heart:  Non-displaced PMI, chest non-tender; regular rate and rhythm, S1, S2 without murmurs, rubs.  +  S4. Carotid upstroke normal, no bruit. Weak pedal pulses. No edema, no varicosities. Abdomen:  Bowel sounds positive; abdomen soft and non-tender without masses, organomegaly, or hernias noted. No hepatosplenomegaly. Extremities:  No clubbing or cyanosis. Neurologic:  Alert and oriented x 3. Psych:  Normal affect.    ICD Specifications Following MD:  [name], MD     ICD Vendor:  Medtronic     ICD Model Number:  [id number]     ICD Serial Number:  [id number] H ICD DOI:  04/19/2008     ICD Implanting MD:  [name], MD  Lead 1:    Location: RV     DOI: 04/19/2008     Model #: [id number]     Serial #: [id number]     Status:  active  Indications::  ICM  Explantation Comments: [facility]  ICD Follow Up ICD Dependent:  No      Episodes Coumadin:  No  Brady Parameters Mode VVI     Lower Rate Limit:  40      Tachy Zones VF:  200     VT1:  171     Impression & Recommendations:  Problem # 1:  CARDIOMYOPATHY, ISCHEMIC (ICD-414.8) Mild, EF improved to 50-55% on last echo.  Would increase back to Coreg 6.25 mg two times a day.  Ideally, with CAD and mild ischemic cardiomyopathy he would be on an ACEI, but he is reticent to start new medications.   Problem # 2:  VENTRICULAR FIBRILLATION (ICD-427.41) Has ICD, on Coreg.   Problem # 3:  CORONARY ARTERY DISEASE; S/P CABG (ICD-414.00) No ischemic symptoms.  Continue current medications including ASA, Crestor, Coreg.   Problem # 4:  HYPERLIPIDEMIA (ICD-272.4) Lipids/LFTs in 2 months.    Patient Instructions: 1)  Your physician has recommended you make the following change in your medication:  2)  Take Crestor 20mg  one daily in the evening 3)  Increase Coreg(carvedilol) to 6.25mg  twice a day 4)  Your physician recommends that you return for a FASTING lipid profile/Liver profile in 2 months  414.00 272.4 v58.69 5)  Your physician recommends that you schedule a follow-up appointment in: 6 months with Dr Marca Ancona Prescriptions: CRESTOR 20 MG TABS (ROSUVASTATIN CALCIUM) one tablet in the evening  #30 x 3   Entered by:   Katina Dung, RN, BSN   Authorized by:   Marca Ancona, MD   Signed by:   Katina Dung, RN, BSN on 12/13/2009   Method used:   [facility]ron[facility] to        [facility] [phone][facility] (retail)       [address]       [place], [place]  [address]       Ph: [phone] or [phone]       Fax: [phone]   RxID:   [id number] COREG 6.25 MG TABS (CARVEDILOL) one  tablet twice a day  #60 x 6   Entered by:   Katina Dung, RN, BSN   Authorized by:   Marca Ancona, MD   Signed by:   Katina Dung, RN, BSN on 12/13/2009   Method used:    Electronically to        [facility] [phone]* (retail)       [address]       [place], [place]  [address]       Ph: [phone] or [phone]       Fax: [phone]   RxID:   [id number]

## 2010-12-12 NOTE — Miscellaneous (Signed)
  Clinical Lists Changes  Medications: Changed medication from CRESTOR 10 MG  TABS (ROSUVASTATIN CALCIUM) 1 by mouth once daily to CRESTOR 10 MG  TABS (ROSUVASTATIN CALCIUM) 1 by mouth twice daily - Signed Rx of CRESTOR 10 MG  TABS (ROSUVASTATIN CALCIUM) 1 by mouth twice daily;  #60 x 11;  Signed;  Entered by: Duncan Dull, RN, BSN;  Authorized by: Marca Ancona, MD;  Method used: [id number] [address] [place], [place]  [address], Ph: [phone] or [phone], Fax: [phone] Observations: Added new observation of PI CARDIO: Pt will increase his Crestor to 20mg  daily and will decrease his Coreg to 3.125mg  twice daily (1/2 tablet two times a day). Per Dr. Shirlee Latch. (11/17/2009 17:18)    Prescriptions: CRESTOR 10 MG  TABS (ROSUVASTATIN CALCIUM) 1 by mouth twice daily  #60 x 11   Entered by:   Duncan Dull, RN, BSN   Authorized by:   Marca Ancona, MD   Signed by:   Duncan Dull, RN, BSN on 11/17/2009   Method used:   Electronically to        [facility] [phone]* (retail)       [address]       [place], [place]  [address]       Ph: [phone] or [phone]       Fax: [phone]   RxID:   [id number]    Patient Instructions: 1)  Pt will increase his Crestor to 20mg  daily and will decrease his Coreg to 3.125mg  twice daily (1/2 tablet two times a day). Per Dr. Shirlee Latch.

## 2010-12-12 NOTE — Miscellaneous (Signed)
  Clinical Lists Changes  Medications: Changed medication from COREG 6.25 MG TABS (CARVEDILOL) one tablet twice a day to COREG 6.25 MG TABS (CARVEDILOL) one  half tablet twice a day

## 2010-12-12 NOTE — Progress Notes (Signed)
Summary: [place] out of the country   ---- Converted from flag ---- ---- 03/28/2010 9:50 AM, Judie Grieve wrote: This pt has moved out of the country per pt daughter Mauri [place]. ------------------------------

## 2010-12-12 NOTE — Progress Notes (Signed)
   Pt. signed release, copied records mailed out today  [facility]  December 14, 2009 10:14 AM

## 2010-12-12 NOTE — Consult Note (Signed)
Summary: [facility]  [facility]   Imported By: Sherian Rein 01/10/2010 07:57:27  _____________________________________________________________________  External Attachment:    Type:   Image     Comment:   External Document

## 2011-01-27 LAB — CBC
MCV: 90.3 fL (ref 78.0–100.0)
Platelets: 149 10*3/uL — ABNORMAL LOW (ref 150–400)
RDW: 13.8 % (ref 11.5–15.5)
WBC: 9.4 10*3/uL (ref 4.0–10.5)

## 2011-01-27 LAB — BASIC METABOLIC PANEL
GFR calc non Af Amer: >60 mL/min (ref >60)
Sodium: 140 mEq/L (ref 135–145)

## 2011-01-27 LAB — DIFFERENTIAL
Basophils Absolute: 0 10*3/uL (ref 0.0–0.1)
Lymphocytes Relative: 26 % (ref 12–46)

## 2011-03-27 NOTE — Assessment & Plan Note (Signed)
[facility]                         ELECTROPHYSIOLOGY OFFICE NOTE   NAME:[name], [name]                MRN:          [id number]  DATE:07/20/2008                            DOB:          Apr 06, 1935    [name] is seen following an ICD implantation for VT/VF in the  setting of an antecedent history of his ischemic heart disease and prior  bypass surgery.  He has done well without intercurrent problems.  He has  no complaints of chest pain or shortness of breath related to his ICD.  His big question was about sexual relations.   Medications currently include metoprolol 25 twice a day, aspirin, and  Crestor.  He is notably not on an ACE inhibitor.   PHYSICAL EXAMINATION:  VITAL SIGNS:  His blood pressure is 126/72 and  his pulse is 63.  LUNGS:  Clear.  HEART:  Sounds were regular.  EXTREMITIES:  Without edema.   IMPRESSION:  1. Ventricular tachycardia/ventricular fibrillation.  2. Ischemic heart disease.      a.     Prior bypass surgery.      b.     Ejection fraction of 40-45%, a catheterization in June 2009.   [name] is stable status post ICD implantation for VT/VF in the  setting of chronic ischemic heart disease.  He will continue medical  therapy.  We will see him again in 9 months' time and he will be  followed remotely in the interim.     Duke Salvia, MD, [facility]  Electronically Signed    SCK/MedQ  DD: 07/20/2008  DT: 07/21/2008  Job #: [id number]   cc:   Vesta Mixer, M.D.

## 2011-03-27 NOTE — Consult Note (Signed)
NAMEMERLE, CIRELLI         ACCOUNT NO.:  [id number]   MEDICAL RECORD NO.:  [id number]          PATIENT TYPE:  INP   LOCATION:  2901                         FACILITY:  [facility]   PHYSICIAN:  Doylene Canning. Ladona Ridgel, MD    DATE OF BIRTH:  1935-10-09   DATE OF CONSULTATION:  04/16/2008  DATE OF DISCHARGE:                                 CONSULTATION   REQUESTING PHYSICIAN:  Vesta Mixer, MD   INDICATION FOR CONSULTATION:  Evaluation of patient with VF arrest.   The patient is a very pleasant 76 year old male with no coronary artery  disease.  He has hypertension.  He is status post 2-vessel bypass  surgery in 2001.  He also has a history of dyslipidemia.  He presented  to the emergency room on April 15, 2008, with chest pain.  He was seen to  have PVCs.  No ST elevation was noted.  The patient subsequently went  into ventricular fibrillation out of his PVCs and was defibrillated  successfully.  His initial cardiac enzymes did not show any significant  elevation and his EKGs did not show ST elevation.  The patient was  admitted to hospital and observed.  He had a beta-blocker therapy.  He  has nothing on catheterization demonstrating that his native coronaries  are occluded and his bypass grafts are patent.  His LV function is down,  probably 45%-50% but not severely so.  The patient has never had a spell  like this before, though he does admit to an episode of syncope back in  1999 and not clear to me how important this is in his overall history.   SOCIAL HISTORY:  The patient is married.  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  He denies tobacco or ethanol  use.   FAMILY HISTORY:  Noncontributory due to his advanced age.   REVIEW OF SYSTEMS:  Notable for chest pain as previously noted.  He  otherwise feels well.  He has very mild arthritis.  The rest of the  review of systems was negative.   PHYSICAL EXAMINATION:  GENERAL:  He is a pleasant  well-appearing  Filipino man in no distress.  VITAL SIGNS:  Blood pressure was 120/70, pulse was 64 and regular, and  respirations were 18.  HEENT:  Normocephalic and atraumatic.  Pupils were equal and round.  Oropharynx was moist.  Sclerae were anicteric.  NECK:  Revealed no jugular venous distention.  No thyromegaly.  Trachea  was midline.  Carotids were 2+ and symmetric.  LUNGS:  Clear bilaterally to auscultation.  No wheezes, rales, or  rhonchi present.  There was no increased work of breathing.  CARDIOVASCULAR:  Regular rate and rhythm.  Normal S1 and S2.  The PMI  was not enlarged nor laterally displaced.  ABDOMEN:  Soft, nontender, and nondistended.  There was no organomegaly.  The bowel sounds were present.  No rebound or guarding.  EXTREMITIES:  Demonstrated no cyanosis, clubbing, or edema.  The pulses  were 2+ and symmetric.  NEUROLOGIC:  Alert and oriented x3.  His cranial nerves intact.  Strength was  5/5 and symmetric.  SKIN:  Normal.   EKG demonstrates sinus rhythm with incomplete right bundle branch block.   IMPRESSION:  1. Ventricular fibrillation arrest status post successful      resuscitation.  2. Ischemic heart disease with relatively preserved left ventricular      function.  3. Dyslipidemia.   DISCUSSION:  The etiology of the patient's event is unclear, though I  suspect coronary ischemia perhaps at a low level maybe playing a role.  Despite this, he did not have any significant CK-MB rise with his  current event.  As there is no reversible etiology to explain his  symptoms that we can treat, I recommend that we proceed with ICD  implantation which will be scheduled in the next several days.  We would  titrate his beta-blockers as his heart rate and blood pressure tolerate.  We will treat his anginal symptoms aggressively.      Doylene Canning. Ladona Ridgel, MD  Electronically Signed     GWT/MEDQ  D:  04/16/2008  T:  04/17/2008  Job:  [id number]

## 2011-03-27 NOTE — Op Note (Signed)
NAMEDAMMON, MAKAREWICZ         ACCOUNT NO.:  [id number]   MEDICAL RECORD NO.:  [id number]          PATIENT TYPE:  AMB   LOCATION:  DAY                          FACILITY:  [facility]   PHYSICIAN:  Excell Seltzer. Annabell Howells, M.D.    DATE OF BIRTH:  09-07-1935   DATE OF PROCEDURE:  05/15/2007  DATE OF DISCHARGE:                               OPERATIVE REPORT   PREOPERATIVE DIAGNOSES:  Bladder outlet obstruction after a  transurethral resection of the prostate.   POSTOPERATIVE DIAGNOSES:  Bladder outlet obstruction after a  transurethral resection of the prostate.   PROCEDURE:  1. Cystourethroscopy.  2. Transurethral resection of the prostate.   SURGEON:  Bjorn Pippin, M.D.   ASSISTANT:  Tarri Glenn.   ANESTHESIA:  General endotracheal.   DRAINS:  A 22-French, 30-mL Foley catheter.   COMPLICATIONS:  None apparent.   ESTIMATED BLOOD LOSS:  Minimal.   INDICATIONS FOR PROCEDURE:  Mr. Gatliff is a 76 year old male who is  status post transurethral resection of the prostate.  He was evaluated  for persistent voiding complaints.  He had a slow PVR which showed  markedly reduced, 2 mL, with a post void residual that was elevated.  He  was reevaluated with cystourethroscopy which showed regrowth apically as  well as some scarring.  He opted for redo transurethral resection of the  prostate.   DESCRIPTION OF PROCEDURE IN DETAIL:  The patient was brought to the  operating room.  He was identified by his arm band.  Informed consent  was verified, and preoperative time out was performed.  After the  successful induction of general endotracheal anesthesia, the patient was  moved to the dorsal lithotomy position.  All appropriate pressure points  were headed to avoid nerve damage or compartment syndrome.  Perioperative antibiotics were administered.  The perineum was prepped  and draped.  Surgeon was Designer, jewellery.  A 22-French cystoscopic  sheath was used to introduce a 30-degree cystoscopic  lens  transurethrally into the bladder.  Pan cystourethroscopy was performed.  Bilateral ureteral orifices were noted to be in normal anatomic position  along the trigone. Urethra was seen to efflux clear urine. Bladder was  1+ trabeculated. Evaluation of the prostatic urethra showed some bilobar  enlargement. There was no large median lobe. There was scarring  consistent with the patient's prior TUR. The remainder of the bladder  was evaluated with 30 and 70 degree lenses and found to be free of any  foreign bodies, diverticula, papillary lesions or erythema. The  cystoscope was removed. Sissy Hoff sounds were used to dilate the urethra  to a caliber of 32 [place]. The resectoscopic sheath was passed by  obturator. The working element of the scope was then inserted, and a  systematic transurethral resection was carried out. We identified the  ureteral orifices. We began resection the apex and lateral lobes down to  the prosthetic surgical capsule. Great care was taken to stay proximal  to the verumontanum at all times. Once we had resected, we noted widely  patent channel.  a Toomeysyringe was then attached to the sheath, and all of the prostate  chips were evacuated. We reinserted the scope and evaluated bladder and  saw no retained chips. We then moved to the prostatic urethra and used  electrocautery to coagulate the bleeding. Once hemostasis was excellent,  the sheath and scope were removed. Catheter was passed with a catheter  guide into the bladder and the balloon inflated with 30 mL sterile  water. It was connected to continuous bladder irrigation with normal  saline  and straight drainage. Drainage was light pink. At this time,  the procedure was terminated. The patient tolerated the well, and there  were no complications. Bjorn Pippin was the primary surgeon and performed  all aspects of the procedure.   DISPOSITION:  The patient was transferred safely to the PACU.      ______________________________  Terie Purser, MD      Excell Seltzer. Annabell Howells, M.D.  Electronically Signed    JH/MEDQ  D:  05/15/2007  T:  05/15/2007  Job:  [id number]

## 2011-03-27 NOTE — Cardiovascular Report (Signed)
NAMEMYCHAEL, SMOCK NO.:  [id number]   MEDICAL RECORD NO.:  [id number]          PATIENT TYPE:  INP   LOCATION:  2901                         FACILITY:  [facility]   PHYSICIAN:  Vesta Mixer, M.D. DATE OF BIRTH:  1935-10-26   DATE OF PROCEDURE:  04/16/2008  DATE OF DISCHARGE:                            CARDIAC CATHETERIZATION   [name] is a 76 year old gentleman with a history of  coronary artery disease.  He is status post coronary artery bypass  grafting.  He is also status post right coronary artery stenting.  He  presented to the emergency room yesterday with some chest pressure and  was found to have torsade de pointes/ventricular fibrillation.  He  required defibrillation x1.  He did not need to be intubated.  He ruled  in for myocardial infarction and is referred now for heart  catheterization.   The right femoral artery was easily cannulated using a modified  Seldinger technique.  At the end of the case, angiography revealed that  it was a normal-looking graft.  Angio-Seal closure was attempted by the  technician, but it was unsuccessful.  We applied manual pressure with  eventual good hemostasis.   FINDINGS:  Hemodynamics:  LV pressure is 109/4 with an aortic pressure  of 109/61.   ANGIOGRAPHY:  Left main:  The left main has minor luminal  irregularities.  The left anterior descending artery is occluded  proximally.   The left circumflex artery is subtotally occluded.  There is TIMI grade  1 flow down a very small obtuse marginal artery.  This vessel appears to  be small, and he is not a good candidate for angioplasty.   The right coronary artery has a stent in the proximal segment.  It is  patent, through that stent which supplies several RV marginal branches.  The vessel is occluded following the stent.   The saphenous vein graft to the right coronary artery is a large graft  and is normal.   The saphenous vein graft to the  diagonal artery is a normal graft and is  normal.  The saphenous vein graft to the second obtuse marginal artery  is a large graft.  The anastomosis is normal and the flow down to the OM-  2 is normal.   The left internal mammary artery to the LAD is widely patent.  The  anastomosis is normal and the LAD is patent.  There are some minor  luminal irregularities in the LAD.   The left ventriculogram revealed mild left ventricular dysfunction.  The  ejection fraction is 40%-45%.   COMPLICATIONS:  None.   CONCLUSION:  Severe native coronary artery disease, but with patent  grafts to the LAD, diagonal artery, obtuse marginal artery #2, and right  coronary artery.  His left ventricular systolic function is mildly  depressed, but certainly not low enough to cause cardiac arrest.  I have  discussed the case with Dr. Lewayne Bunting.  He will likely need an ICD.  He will consult on him this afternoon.   He does have a small obtuse marginal artery that is subtotally occluded.  I do not  think that this vessel is large enough to really place a stent  in.  It appears to be between 1 and 1-1/2 mm in diameter.  There is also  a fairly small distribution and this area is not likely to be the cause  of his ventricular fibrillation.           ______________________________  Vesta Mixer, M.D.     PJN/MEDQ  D:  04/16/2008  T:  04/16/2008  Job:  [id number]   cc:   Doylene Canning. Ladona Ridgel, MD  CVTS

## 2011-03-27 NOTE — H&P (Signed)
[name], [name]         ACCOUNT NO.:  [id number]   MEDICAL RECORD NO.:  [id number]          PATIENT TYPE:  INP   LOCATION:  1843                         FACILITY:  [facility]   PHYSICIAN:  Elmore Guise., M.D.DATE OF BIRTH:  05-09-1935   DATE OF ADMISSION:  04/15/2008  DATE OF DISCHARGE:                              HISTORY & PHYSICAL   INDICATION FOR ADMISSION:  Chest pain and Ventricular tachycardia  (torsades de pointes).   PRIMARY CARDIOLOGIST:  Dr. Delane Ginger.   HISTORY OF PRESENT ILLNESS:  The patient is a very pleasant 76 year old  Filipino male with past medical history of coronary artery disease  status post 2-vessel coronary artery bypass grafting August 2001  (dyslipidemia) who presents with acute onset of chest pain.  History is  obtained through the daughter who is his Nurse, learning disability.  The patient  actually reports symptoms starting this afternoon at 3:30 with  substernal chest pain.  This was associated with shortness of breath as  well as palpitations.  EMS was called.  The patient was brought to the  ER.  On arrival, the patient was noted to be in ventricular bigeminy.  This was followed by VT arrest (torsades).  The patient received direct  shock x1 with resolution.  He did not require intubation.  He briefly  had CPR.  His chest pain resolved.  His blood pressure and heart rate  were stable.  The patient now reports some achy substernal chest pain  likely from his shock.  He is currently on nitroglycerin drip.  No  shortness of breath.  Telemetry shows normal sinus rhythm rate in the  60s.  He denies any recent orthopnea or PND.  No over-the-counter drug  use.  No recent antibiotic use.   His current medications are Crestor and aspirin.   REVIEW OF SYSTEMS:  Are otherwise negative.   ALLERGIES:  None.   FAMILY HISTORY:  Is noncontributory.   SOCIAL HISTORY:  Is married.  He still works full time.  No tobacco or  alcohol use.   PHYSICAL  EXAMINATION:  He is afebrile.  Temperature is 99, blood  pressure 130/70, heart rate 67, respirations 16, saturating 100% on room  air.  In general, he is very pleasant Filipino male, alert and oriented  x4 in no acute distress.  Does appear younger than stated age.  HEENT:  Appeared normal.  NECK:  Supple.  No lymphadenopathy, 2+ carotids.  No JVD and no bruits.  LUNGS:  Clear.  HEART:  Is regular with no rub noted.  ABDOMEN:  Soft, nontender, nondistended.  No rebound or guarding.  EXTREMITIES:  Warm with 2+ pulses and no edema.   Blood work shows a BUN and creatinine of 17 and 1.1, potassium of 3.1.  Magnesium level still pending at the time of dictation.  LFTs are  normal.  Myoglobin is 219.  Troponin I less than 0.05, MB 1.7.  Coags  show PT/INR 13.4 and 1.0, PTT of 28.  White count was 13.1, platelets of  204, hemoglobin is 13.8.  Chest x-ray shows no acute cardiopulmonary  disease.  ECG shows normal sinus rhythm,  right bundle branch block,  nonspecific ST-T wave changes.  QT interval is 430 milliseconds.  Rhythm  strips did showed torsades with successful cardioversion x1.  The  patient was given magnesium and potassium replacement in the emergency  room.  He was started on nitroglycerin drip and heparin.   IMPRESSION:  1. Chest pain.  2. Torsades.  3. History of coronary artery disease status post coronary artery      bypass grafting August 2001.  4. Dyslipidemia.   PLAN:  The patient will be admitted to the CCU for monitoring.  Will  continue nitroglycerin drip, heparin, aspirin, statin.  Start low-dose  Lopressor 12.5 mg q.12h. as needed.  We will replace his potassium and magnesium which was given in the  emergency room.  He will have several cardiac enzymes and be set up for  cardiac catheterization in the morning.  Further measures per Dr. Delane Ginger.      Elmore Guise., M.D.  Electronically Signed     TWK/MEDQ  D:  04/15/2008  T:  04/15/2008  Job:   [id number]

## 2011-03-30 NOTE — Discharge Summary (Signed)
[name], OFARRELL NO.:  [id number]   MEDICAL RECORD NO.:  [id number]                   PATIENT TYPE:  INP   LOCATION:  2019                                 FACILITY:  [facility]   PHYSICIAN:  Elmore Guise., M.D.           DATE OF BIRTH:  11/02/1935   DATE OF ADMISSION:  05/31/2004  DATE OF DISCHARGE:  06/01/2004                                 DISCHARGE SUMMARY   ADMISSION DIAGNOSIS:  Atypical chest pain.   DISCHARGE DIAGNOSES:  1. Atypical chest pain.  2. Dyslipidemia.  3. History of coronary disease.   HOSPITAL COURSE:  The patient was admitted after he presented for routine  follow-up evaluation and described increasing chest pain.  The patient has a  history of coronary disease undergoing CABG approximately five years ago.  His hospital course was uncomplicated.  After admission he remained chest  pain-free and actually had significant improvement after he reports large  bowel movement.  He has been ambulatory and tolerating p.o. well with no  recurrence of chest pain.  On further questioning, he reports occasional  sharp chest pain in his right and left shoulder which he notes is worse with  activity, improved with ibuprofen.  Cardiac enzymes remained negative during  his hospitalization.  Telemetry showed normal sinus rhythm.  Blood pressure  remained normal.  Abdominal ultrasound was obtained to evaluate for either  liver or gallbladder cause of his pain, which was negative.  The patient  will be discharged home today to continue on the following medications.   DISCHARGE MEDICATIONS:  1. Aspirin 81 mg daily.  2. Crestor 10 mg a day.  3. Ibuprofen over-the-counter 200 mg one to two p.o. q.6-8h. p.r.n. pain.  4. Nitroglycerin on p.r.n. basis.  5. Nexium 40 mg daily.   The patient was instructed to only perform light duty until seen in follow-  up.  His diet should be low cholesterol, low fat.  He will follow up with  Dr. Delane Ginger  with [facility] in one to two weeks.  Should he  have any questions, he is to notify the office for further instructions.                                                Elmore Guise., M.D.    TWK/MEDQ  D:  06/01/2004  T:  06/02/2004  Job:  [id number]

## 2011-03-30 NOTE — H&P (Signed)
NAMEOLUWATOMISIN, DEMAN                     ACCOUNT NO.:  [id number]   MEDICAL RECORD NO.:  [id number]                   PATIENT TYPE:  INP   LOCATION:  2019                                 FACILITY:  [facility]   PHYSICIAN:  Vesta Mixer, M.D.              DATE OF BIRTH:  1935-09-01   DATE OF ADMISSION:  05/31/2004  DATE OF DISCHARGE:                                HISTORY & PHYSICAL   SUBJECT NUMBER:  [id number].   Mr. Tillman is a middle-aged Filipino gentleman with a history of coronary  artery disease and coronary artery bypass grafting.  He has done well since  I last saw him in May.  This morning he started having severe episodes of  chest and abdominal pain.  He took two nitroglycerin with no relief.  The  pain was described as a very sharp pressure which radiated up to his neck.  It was associated with nausea.  When he came to the office, he was doubled  over in pain.  He felt a little bit better when he got up on the bed.  He  has been quite active and working at [employer].  He has not had any  exertional chest pains.  He has not had any episodes of chest pain or  shortness of breath.  He has been able to do all of his normal activities  without any significant problems.   CURRENT MEDICATIONS:  1. Enteric-coated aspirin 81 mg a day.  2. Crestor 10 mg a day.   ALLERGIES:  He has no known drug allergies.   PAST MEDICAL HISTORY:  1. History of coronary artery disease, status post coronary artery bypass     grafting.  2. Hyperlipidemia.   SOCIAL HISTORY:  The patient is a nonsmoker.   FAMILY HISTORY:  Noncontributory.   REVIEW OF SYSTEMS:  The patient denies any problems with his ears, nose, and  throat.  He denies any heat or cold intolerance.  He denies any weight gain  or weight loss.  He denies any cough or sputum production.  He denies any GI  problems.  He denies any association of symptoms related to eating.  Denies  any chest pain with change in position or  taking a deep breath.  He is able  to exercise without any problems.   PHYSICAL EXAMINATION:  GENERAL APPEARANCE:  On exam, he is a middle-aged  gentleman in no acute distress.  The interview took place with his daughter,  Mora Bellman, who served as the interpreter.  WEIGHT:  His weight was not obtained because of the patient's severe pain.  VITAL SIGNS:  His blood pressure was 100/70 with a heart rate of 63.  HEENT:  Reveals 2+ carotids.  He has no bruits.  There is no JVD and no  thyromegaly.  LUNGS:  Clear to auscultation.  HEART:  Regular rate.  S1 and S2 with no murmurs, rubs, or gallops.  ABDOMEN:  Good bowel sounds.  Nontender.  EXTREMITIES:  He has no cyanosis, clubbing, or edema.  NEUROLOGIC:  Exam was nonfocal.   LABORATORY DATA:  His EKG reveals normal sinus rhythm.  He has no ST or T  wave changes.  There are no acute changes.   IMPRESSION:  Mr. Dauria presents with severe episodes of chest pain.  He  does have a history of coronary artery disease, although these symptoms are  somewhat atypical.  We will admit him to the hospital and collect serial  enzymes.  We will get a gallbladder ultrasound tomorrow morning.  We will  check a hepatic profile as well.  If he has further GI problems, then we  will call Corwin Levins, M.D., for further recommendations.  Rosine Abe, M.D., will see the patient over the next several days in my absence.                                                Vesta Mixer, M.D.    PJN/MEDQ  D:  05/31/2004  T:  05/31/2004  Job:  [id number]   cc:   Corwin Levins, M.D. [facility]

## 2011-03-30 NOTE — Cardiovascular Report (Signed)
[place]. [facility]  Patient:    [name], [name]                  MRN: [id number] Proc. Date: 05/31/00 Attending:  Alvia Grove., M.D. CC:         Cardiac Cath Lab             CVTS office                        Cardiac Catheterization  INDICATIONS:  The patient is a 76 year old Filipino gentleman.  It appeared he has a history of coronary artery disease and is status post PTCA and stenting of his right coronary artery.  He was found to have a 70% stenosis of his left anterior descending artery at that time.  He presents now with worsening angina.  PROCEDURE:  Left heart catheterization with coronary angiography.  DESCRIPTION OF PROCEDURE:  The right femoral artery was easily cannulated using modified Seldinger technique.  HEMODYNAMICS:  The left ventricular pressure was 101/12 with an aortic pressure of 101/57.  ANGIOGRAPHY:  The 6-French system was used ______.  The left 4 catheter and the pigtail catheter were exchanged out over a guidewire.  The left main coronary artery is relatively smooth and normal.  The left anterior descending artery is severely diseased in the proximal segment with a long eccentric 80-90% stenosis in the proximal left anterior descending artery which starts just after giving off a small to moderate size diagonal branch.  The mid LAD is moderately and diffusely diseased for the next 40 mm or 50 mm.  The average stenosis is between 50% and 60%.  The LAD then has a fairly normal segment followed by a 30-40% plaque.  The distal LAD has minor luminal irregularities, but no critical stenoses.  The LAD gives off a first diagonal branch which is relatively small.  The second diagonal branch is a relatively large vessel which bifurcates fairly early.  Each of the branches of this second diagonal branch has 70% stenosis in the proximal aspect.  There is a first septal branch which has an 80% stenosis in the proximal  segment.  The left circumflex artery is a relatively small vessel.  There are moderate luminal irregularities in the left circumflex artery.  The first obtuse marginal artery is unremarkable.  The right coronary artery is very large and dominant.  The stent is clearly visible in the proximal aspect of this vessel.  There is a slight amount of restenosis within the stent between 20% and 30%.  There are minor luminal irregularities just distal to the stent.  The distal right coronary artery and the posterior descending artery as well as the posterolateral segment artery are unremarkable.  The left ventriculogram was performed in a 30 RAO position.  It revealed hypokinesis of the inferior wall, but with relatively well-preserved left ventricular systolic function.  The LVEF is between 55% and 60%.  There is no mitral regurgitation.  CONCLUSIONS: 1. Severe disease involving the proximal mid left anterior descending and a    large diagonal branch. 2. There is moderate disease involving the right coronary artery with a patent    stent.  We will discuss these results with the patient.  The left anterior    descending is somewhat of an unfavorable lesion.  The diagonals appear to    be too small to place a stent and the left of the left anterior descending  stenosis is quite long.  We will discuss these results with the CVTS    surgeons. DD:  06/05/00 TD:  06/06/00 Job: [id number]

## 2011-03-30 NOTE — Discharge Summary (Signed)
NAMEJOURDAN, [name]         ACCOUNT NO.:  [id number]   MEDICAL RECORD NO.:  [id number]          PATIENT TYPE:  INP   LOCATION:  4736                         FACILITY:  [facility]   PHYSICIAN:  Vesta Mixer, M.D. DATE OF BIRTH:  1935/07/15   DATE OF ADMISSION:  04/15/2008  DATE OF DISCHARGE:  04/20/2008                               DISCHARGE SUMMARY   DISCHARGE DIAGNOSES:  1. Ventricular tachycardia/ventricular fibrillation.  2. History of automatic defibrillator implant.  3. History of coronary artery disease - status post coronary artery      bypass grafting.  4. Automatic implantable cardioverter-defibrillator.   DISCHARGE MEDICATIONS:  1. Darvocet-N 100 1-2 tabs every 4-6 hours as needed for pain.  2. Aspirin 325 mg a day.  3. Metoprolol 12.5 mg twice a day.  4. Crestor 20 mg a day.  5. Nitroglycerin 0.4 mg sublingually as needed.  6. Nexium 40 mg a day.   DISPOSITION:  The patient will follow up with Dr. [name] at [facility] at the [facility] office.  He will follow up with Dr. Elease Hashimoto in  the next 1-2 weeks.   HISTORY:  [name] is a 76 year old gentleman with a history of  coronary artery disease and coronary bypass grafting.  He presented to  hospital with some near syncope.  He developed ventricular fibrillation  while in the emergency room and was cardioverted.  Please see dictated H  and P for further details.   HOSPITAL COURSE:  Ventricular fibrillation.  The patient was  successfully defibrillated in the emergency room.  He was able to be  defibrillated so quickly that he was not intubated.  He regained  consciousness almost immediately.  He did not have any further problems.  He had been started on amiodarone.  The following day, he had a heart  catheterization, which revealed that his native LAD was occluded.  His  left circumflex artery was subtotally occluded.  His right coronary  artery had a proximal stent, which was patent, but then the  vessel was  occluded after that.  The saphenous vein graft to the right coronary  artery was normal.  The saphenous vein graft to the diagonal artery was  normal.  The saphenous vein graft to the obtuse marginal artery was  normal and the left internal mammary artery to the LAD was open.  He had  mildly reduced left ventricular systolic function with an ejection  fraction of 40-45%.   The patient had placement of an AICD.  He tolerated the procedure quite  well and did not have any further problems.  The patient was discharged  to home in satisfactory condition on April 20, 2008.  All of his other  medical problems remain fairly stable.           ______________________________  Vesta Mixer, M.D.     PJN/MEDQ  D:  06/07/2008  T:  06/08/2008  Job:  [id number]   cc:   Doylene Canning. Ladona Ridgel, MD

## 2011-03-30 NOTE — Discharge Summary (Signed)
[place]. [facility]  Patient:    [name], [name]                  MRN: [id number] Adm. Date:  [id number] Disc. Date: [id number] Attending:  Cleatrice Burke CC:         Barry Dienes. Eloise Harman, M.D.  Alvia Grove., M.D.  Rozanna Boer., M.D.   Discharge Summary  DATE OF BIRTH:  01/16/1935  ADMISSION DIAGNOSIS:  Two-vessel coronary artery disease with crescendo anginal symptoms.  SECONDARY DIAGNOSES: 1. Hypercholesterolemia. 2. Hypertension.  FINAL DIAGNOSES: 1. Two-vessel coronary artery disease, status post coronary artery bypass    grafting on June 17, 2000. 2. Bladder neck contracture, resolved.  ALLERGIES:  This patient has no known drug allergies.  HISTORY OF PRESENT ILLNESS:  This patient is a 76 year old gentleman from [place] who has been in this country for approximately two years and does not speak much [place].  He has a history of coronary artery disease and is status post PTCA and stent of the right coronary in 1999 after an acute inferior wall MI.  He has done well until recently when he began having symptoms that were similar to his myocardial infarction pain.  This prompted him to have his coronary artery disease reevaluated.  On May 31, 2000, he underwent cardiac catheterization by Alvia Grove., M.D., which revealed severe two-vessel coronary artery disease.  After evaluation by Alleen Borne, M.D., in his office on June 11, 2000, Mr. [name], his daughter, and Dr. Laneta Simmers agreed that proceed with coronary artery bypass surgery would be the best treatment for this patient.  HOSPITAL COURSE:  On June 17, 2000, Mr. Malter was admitted to [facility]. [facility] to Alleen Borne, M.D., and underwent an uncomplicated coronary artery bypass grafting x 4.  Grafts placed at the time of the procedure were left internal mammary artery to the LAD, saphenous vein graft to the RCA, saphenous  vein graft to the first diagonal, and saphenous vein graft to the second diagonal.  Prior to the beginning of the surgery, a GU consult was obtained with Rozanna Boer., M.D., when the Foley catheter was unable to be passed.  Dr. Aldean Ast dilated his bladder neck with sounds and inserted a 20 [place] tip Foley.  The catheter drained freely throughout the procedure.  At the completion of the procedure, the patient was transferred in stable condition to the SICU.  This patients postoperative course has been uneventful and he has progressed very quickly. Alleen Borne, M.D., anticipates discharge on June 22, 2000.  The Foley catheter was discontinued on June 20, 2000.  The patient has had no difficulty voiding since.  DISCHARGE MEDICATIONS: 1. Enteric-coated aspirin one q.d. 2. Ultram one to two p.o. q.4-6h. p.r.n. for pain. 3. Lopressor 25 mg b.i.d. 4. Zocor 20 mg q.d. 5. He was instructed to resume his Zantac 75 mg q.d.  FOLLOW-UP:  The patient was asked to see Alvia Grove., M.D., in his office in two weeks.  He has an appointment to see Alleen Borne, M.D., on Tuesday, July 16, 2000, at 9:45 a.m.  He was instructed to obtain a chest x-ray at [facility] just prior to this appointment.  DD:  06/21/00 TD:  06/23/00 Job: [id number]

## 2011-03-30 NOTE — Op Note (Signed)
[place]. [facility]  Patient:    [name], [name]                  MRN: [id number] Proc. Date: 06/17/00 Adm. Date:  [id number] Attending:  Cleatrice Burke CC:         Alvia Grove., M.D.  Cardiac Catheterization Lab - [place]   Operative Report  PREOPERATIVE DIAGNOSES:  Severe 2 vessel coronary disease with unstable angina.  POSTOPERATIVE DIAGNOSES:  Severe 2 vessel coronary disease with unstable angina.  OPERATIVE PROCEDURE:  Median sternotomy, extracorporeal circulation, coronary artery bypass graft surgery x 4 using a left internal mammary artery graft to the left anterior descending coronary, with a saphenous vein graft to the first diagonal branch of the LAD, a saphenous vein graft to the second diagonal branch of the LAD, and a saphenous vein graft to the right coronary artery.  SURGEON:  Alleen Borne, M.D.  ASSISTANT:  Maxwell Marion, RNFA  ANESTHESIA:  General endotracheal anesthesia.  CLINICAL HISTORY:  The patient is a 76 year old Filipino gentleman who has a history of coronary disease status post PTCA and stenting of the right coronary artery in 1999, after an acute inferior MI.  He has done well until recently when he began having substernal chest pressure and pain radiating up into his neck and throat.  He underwent repeat cardiac catheterization on May 31, 2000, that showed severe 2 vessel coronary disease.  The LAD was severely diseased in its proximal segment with a long eccentric 80-90% stenosis.  There were 2 diagonal branches both of which had 70% proximal stenoses.  The left circumflex was a small vessel with no significant disease.  The right coronary artery was a large dominant vessel with stent in the proximal portion.  There was slight haziness throughout the stented area with about 20-30% stenosis. There were some luminal irregularities just distal to the stent.  Left ventricular ejection fraction  was about 55-60% with hypokinesis of the inferior wall.  After review of the angiograms and examination of the patient it was felt that coronary artery bypass graft surgery was the best treatment.  INFORMED CONSENT:  I discussed the operative procedure with the patient and his daughter who was acting as a Nurse, learning disability.  We discussed alternatives, benefits, and risks including bleeding, possible blood transfusion, infection, stroke, myocardial infarction, graft failure, and death.  They understood and agreed to proceed.  DESCRIPTION OF PROCEDURE:  The patient was taken to the operating room and placed on the table in the supine position.  After induction of general endotracheal anesthesia, a Foley catheter was placed in the bladder using a sterile technique.  Then the chest, abdomen and both lower extremities were prepped and draped in the usual sterile manner.  The chest was entered through a median sternotomy incision and the pericardium opened in the midline. Examination of the heart showed good ventricular contractility.  The ascending aorta had no palpable plaques in it.  Then the left internal mammary artery was harvested from the chest wall as a pedicle graft.  This was a median caliber vessel with excellent blood through it.  At the same time a segment of greater saphenous vein was harvested from the right lower leg, and this vein was of medium size and good quality.  Then the patient was heparinized and when an adequate activated clotting time was achieved, the distal ascending aorta was cannulated using a 6.5 mm aortic cannula for arterial inflow.  Venous outflow  was achieved using the two stage venous cannula through the right atrial appendage.  Antegrade cardioplegia and vent cannula were inserted in the aortic root.  The patient was placed on coronary artery bypass and distal coronary arteries identified.  The patient LAD was a large graftable vessel.  The 2  diagonal branches were both medium caliber graftable vessels that were diseased proximally.  The left circumflex obtuse marginal had no visible disease.  The right coronary artery was diseased proximally but the mid and distal portions had no significant disease.  The aorta was crossclamped and 500 cc of cold blood antegrade cardioplegia was administered in the aortic root with quick arrest of the heart.  Systemic hypothermia to 20 degrees Centigrade and topical hypothermia with iced saline was used.  A temperature probe was placed in the septum and insulating pin in the pericardium.  The first distal anastomosis was performed to the first diagonal branch.  The internal diameter was 1.6 mm.  The conduit used was a  segment of greater saphenous vein.  The anastomosis was performed in a sequential end-to-side manner using continuous 7-0 Prolene sutures.  The flow was measured through the graft was excellent.  The second distal anastomosis was performed to the second diagonal branch. The internal diameter was about 1.75 mm.  The conduit used was the a second segment of greater saphenous vein.  The anastomosis was performed in and end-to-side manner using continuous 7-0 Prolene suture.   Flow was measured through the graft and was excellent.  A third distal anastomosis was performed to the mid portion of the right coronary artery.  The internal diameter was greater than 3 mm.  The conduit used was a third segment of greater saphenous vein.  The anastomosis was performed in a end-to-side manner using continuous 7-0 Prolene suture.  Flow was measured through the graft and was excellent.  Then another dose of cardioplegia was given down the vein graft and in the aortic root.  The fourth distal anastomosis was performed to the distal portion of the left anterior descending coronary artery.  The internal diameter was about 2.0 mm. The conduit used was the left internal mammary artery and this  was brought  through an opening in the left pericardium and anterior to the phrenic nerve. It was anastomosed to the LAD in an end-to-side manner using continuous 7-0 Prolene suture.  The pedicle was tacked to the epicardium with 6-0 Prolene sutures.  The patient was rewarmed to 37 degrees centigrade and the clamp removed from the mammary pedicle.  There was rapid warming of the ventricular septum and return of spontaneous ventricular fibrillation.  The crossclamp was removed at a time of 50 minutes, and the patient defibrillated into sinus rhythm.  A partial occlusion clamp was placed in the aortic root and the three proximal vein grafts anastomoses were performed in an end-to-side manner using a continuous 6-0 Prolene suture.  The clamp was removed, the vein grafts deaired and the clamps removed from them.  The proximal and distal anastomoses appeared hemostatic and the lie of the graft was satisfactory.  Graft markers were placed on the proximal anastomosis.  Two temporary, right ventricular and right atrial pacing wires were placed and brought out through the skin.  When the patient had rewarmed to 37 degrees centigrade, he was weaned from cardiopulmonary bypass on no inotropic agents.  Total bypass time was 90 minutes.  Cardiac function was excellent with a cardiac output of 5-6 liters per minute. Protamine was  given and the venous and aortic cannula were removed without difficulty.  Hemostasis was achieved.  Three chest tubes were placed with two in the posterior pericardium, one in the left pleural space and one in the anterior mediastinum.  The pericardium was reapproximated over the heart.  The sternum was closed with #6 stainless steel wires.  The fascia was closed with continuous #1 Vicryl sutures.  Subcutaneous tissue was closed with continuous 2-0 Vicryl and the skin with 3-0 Vicryl for subcuticular closure.  The lower extremity vein harvest site was closed in layers in a  similar manner.  The sponge, needle and instrument counts were correct according to the scrub nurse.  Dry sterile dressings were applied over the incisions, around the chest tubes which were hooked to Pleuravac suction.  The patient remained hemodynamically stable and was transported to the SICU in guarded but stable condition. DD:  06/17/00 TD:  06/18/00 Job: [id number]

## 2011-05-07 ENCOUNTER — Encounter: Payer: Self-pay | Admitting: Cardiology

## 2011-08-09 LAB — CBC
HCT: 39.9
HCT: 41.5
HCT: 43.6
Hemoglobin: 13
Hemoglobin: 14
Hemoglobin: 14.2
Hemoglobin: 14.6
Hemoglobin: 14.8
MCHC: 33.8
MCHC: 33.9
MCHC: 34
MCHC: 34.4
MCHC: 35.1
MCV: 88.9
MCV: 89.5
MCV: 89.8
Platelets: 172
Platelets: 204
RBC: 4.22
RBC: 4.44
RBC: 4.63
RBC: 4.83
RDW: 13.7
RDW: 14.1
RDW: 14.2
WBC: 13.1 — ABNORMAL HIGH
WBC: 13.6 — ABNORMAL HIGH
WBC: 13.9 — ABNORMAL HIGH

## 2011-08-09 LAB — LIPID PANEL
Cholesterol: 126
LDL Cholesterol: 73
Total CHOL/HDL Ratio: 2.6
Triglycerides: 27
VLDL: 5

## 2011-08-09 LAB — CARDIAC PANEL(CRET KIN+CKTOT+MB+TROPI)
CK, MB: 85.4 — ABNORMAL HIGH
Relative Index: 7.9 — ABNORMAL HIGH
Relative Index: 9.2 — ABNORMAL HIGH
Total CK: 1085 — ABNORMAL HIGH
Troponin I: 12.09
Troponin I: 6.85

## 2011-08-09 LAB — DIFFERENTIAL
Basophils Absolute: 0
Basophils Absolute: 0
Basophils Relative: 0
Basophils Relative: 0
Eosinophils Absolute: 0.2
Eosinophils Absolute: 0.3
Eosinophils Relative: 2
Eosinophils Relative: 2
Monocytes Absolute: 0.8
Monocytes Absolute: 1.4 — ABNORMAL HIGH
Monocytes Relative: 6
Neutro Abs: 9.4 — ABNORMAL HIGH

## 2011-08-09 LAB — BASIC METABOLIC PANEL
BUN: 12
BUN: 13
CO2: 24
CO2: 24
CO2: 27
Calcium: 8.6
Calcium: 8.8
Chloride: 105
Chloride: 108
Creatinine, Ser: 0.88
Creatinine, Ser: 0.92
GFR calc Af Amer: >60
Glucose, Bld: 109 — ABNORMAL HIGH
Glucose, Bld: 127 — ABNORMAL HIGH
Glucose, Bld: 89
Glucose, Bld: 91
Potassium: 4.7
Sodium: 132 — ABNORMAL LOW
Sodium: 139

## 2011-08-09 LAB — URINALYSIS, ROUTINE W REFLEX MICROSCOPIC
Bilirubin Urine: NEGATIVE
Ketones, ur: 15 — AB
Leukocytes, UA: NEGATIVE
Nitrite: NEGATIVE
Nitrite: NEGATIVE
Protein, ur: NEGATIVE
Specific Gravity, Urine: 1.022
Urobilinogen, UA: 1
Urobilinogen, UA: 1

## 2011-08-09 LAB — CULTURE, BLOOD (ROUTINE X 2): Culture: NO GROWTH

## 2011-08-09 LAB — COMPREHENSIVE METABOLIC PANEL
AST: 32
BUN: 17
CO2: 23
Calcium: 8.9
Glucose, Bld: 132 — ABNORMAL HIGH
Potassium: 3.1 — ABNORMAL LOW
Sodium: 139

## 2011-08-09 LAB — HEPATIC FUNCTION PANEL
Alkaline Phosphatase: 63
Indirect Bilirubin: 0.9
Total Bilirubin: 1.2
Total Protein: 6.1

## 2011-08-09 LAB — CK TOTAL AND CKMB (NOT AT ARMC): CK, MB: 3.1

## 2011-08-09 LAB — LIPASE, BLOOD: Lipase: 18

## 2011-08-09 LAB — HEPARIN LEVEL (UNFRACTIONATED)
Heparin Unfractionated: 0.58
Heparin Unfractionated: <0.1 — ABNORMAL LOW

## 2011-08-09 LAB — POCT CARDIAC MARKERS
CKMB, poc: 1.7
CKMB, poc: 4.8
Myoglobin, poc: 219
Myoglobin, poc: >500
Troponin i, poc: <0.05

## 2011-08-09 LAB — URINE MICROSCOPIC-ADD ON

## 2011-08-09 LAB — URINE CULTURE: Colony Count: NO GROWTH

## 2011-08-28 LAB — BASIC METABOLIC PANEL
BUN: 17
BUN: 7
Calcium: 8.9
Calcium: 9.2
Creatinine, Ser: 1.07
GFR calc non Af Amer: >60
GFR calc non Af Amer: >60
Glucose, Bld: 121 — ABNORMAL HIGH
Potassium: 4.3

## 2011-08-28 LAB — CBC
MCHC: 34.6
Platelets: 180
RDW: 14.4 — ABNORMAL HIGH

## 2011-08-28 LAB — HEMOGLOBIN AND HEMATOCRIT, BLOOD: Hemoglobin: 13

## 2011-08-28 LAB — URINALYSIS, ROUTINE W REFLEX MICROSCOPIC
Bilirubin Urine: NEGATIVE
Nitrite: NEGATIVE
Protein, ur: 30 — AB
Specific Gravity, Urine: 1.009
Urobilinogen, UA: 0.2

## 2011-08-28 LAB — URINE MICROSCOPIC-ADD ON

## 2012-07-31 ENCOUNTER — Encounter: Payer: Self-pay | Admitting: Internal Medicine

## 2012-08-07 ENCOUNTER — Encounter: Payer: Self-pay | Admitting: Cardiovascular Disease

## 2018-09-09 ENCOUNTER — Emergency Department (HOSPITAL_COMMUNITY)
Admission: EM | Admit: 2018-09-09 | Discharge: 2018-09-09 | Disposition: A | Discharge status: Home / Self Care | Payer: Medicare Other | Attending: Emergency Medicine | Admitting: Emergency Medicine

## 2018-09-09 ENCOUNTER — Encounter (HOSPITAL_COMMUNITY): Payer: Self-pay | Admitting: Emergency Medicine

## 2018-09-09 ENCOUNTER — Emergency Department (HOSPITAL_COMMUNITY): Payer: Medicare Other

## 2018-09-09 ENCOUNTER — Other Ambulatory Visit: Payer: Self-pay

## 2018-09-09 DIAGNOSIS — Z79899 Other long term (current) drug therapy: Secondary | ICD-10-CM | POA: Insufficient documentation

## 2018-09-09 DIAGNOSIS — Z7982 Long term (current) use of aspirin: Secondary | ICD-10-CM | POA: Insufficient documentation

## 2018-09-09 DIAGNOSIS — T827XXA Infection and inflammatory reaction due to other cardiac and vascular devices, implants and grafts, initial encounter: Secondary | ICD-10-CM

## 2018-09-09 DIAGNOSIS — Y828 Other medical devices associated with adverse incidents: Secondary | ICD-10-CM | POA: Diagnosis not present

## 2018-09-09 DIAGNOSIS — I10 Essential (primary) hypertension: Secondary | ICD-10-CM | POA: Insufficient documentation

## 2018-09-09 DIAGNOSIS — T84099A Other mechanical complication of unspecified internal joint prosthesis, initial encounter: Secondary | ICD-10-CM | POA: Insufficient documentation

## 2018-09-09 DIAGNOSIS — T82198A Other mechanical complication of other cardiac electronic device, initial encounter: Secondary | ICD-10-CM

## 2018-09-09 HISTORY — DX: Essential (primary) hypertension: I10

## 2018-09-09 LAB — CBC WITH DIFFERENTIAL/PLATELET
Abs Immature Granulocytes: 0.02 10*3/uL (ref 0.00–0.07)
BASOS PCT: 1 %
Basophils Absolute: 0 10*3/uL (ref 0.0–0.1)
EOS PCT: 6 %
Eosinophils Absolute: 0.5 10*3/uL (ref 0.0–0.5)
HCT: 37.2 % — ABNORMAL LOW (ref 39.0–52.0)
Hemoglobin: 11.6 g/dL — ABNORMAL LOW (ref 13.0–17.0)
Immature Granulocytes: 0 %
Lymphocytes Relative: 25 %
Lymphs Abs: 2.1 10*3/uL (ref 0.7–4.0)
MCH: 28.4 pg (ref 26.0–34.0)
MCHC: 31.2 g/dL (ref 30.0–36.0)
MCV: 91 fL (ref 80.0–100.0)
MONO ABS: 1 10*3/uL (ref 0.1–1.0)
MONOS PCT: 11 %
Neutro Abs: 4.9 10*3/uL (ref 1.7–7.7)
Neutrophils Relative %: 57 %
PLATELETS: 198 10*3/uL (ref 150–400)
RBC: 4.09 MIL/uL — ABNORMAL LOW (ref 4.22–5.81)
RDW: 14 % (ref 11.5–15.5)
WBC: 8.5 10*3/uL (ref 4.0–10.5)
nRBC: 0 % (ref 0.0–0.2)

## 2018-09-09 LAB — PROTIME-INR
INR: 1.18
Prothrombin Time: 14.9 seconds (ref 11.4–15.2)

## 2018-09-09 LAB — BASIC METABOLIC PANEL
Anion gap: 8 (ref 5–15)
BUN: 20 mg/dL (ref 8–23)
CO2: 24 mmol/L (ref 22–32)
Calcium: 9.6 mg/dL (ref 8.9–10.3)
Chloride: 109 mmol/L (ref 98–111)
Creatinine, Ser: 1.44 mg/dL — ABNORMAL HIGH (ref 0.61–1.24)
GFR calc Af Amer: 51 mL/min — ABNORMAL LOW (ref >60)
GFR, EST NON AFRICAN AMERICAN: 44 mL/min — AB (ref >60)
GLUCOSE: 138 mg/dL — AB (ref 70–99)
POTASSIUM: 3.4 mmol/L — AB (ref 3.5–5.1)
Sodium: 141 mmol/L (ref 135–145)

## 2018-09-09 MED ORDER — CARVEDILOL 25 MG PO TABS: 12.5000 mg | ORAL_TABLET | Freq: Two times a day (BID) | ORAL | 3 refills | Status: DC

## 2018-09-09 MED ORDER — NITROGLYCERIN 0.4 MG SL SUBL: 0.4000 mg | SUBLINGUAL_TABLET | SUBLINGUAL | 3 refills | Status: AC | PRN

## 2018-09-09 MED ORDER — CEPHALEXIN 500 MG PO CAPS: 500.0000 mg | ORAL_CAPSULE | Freq: Two times a day (BID) | ORAL | 0 refills | Status: AC

## 2018-09-09 MED ORDER — ROSUVASTATIN CALCIUM 20 MG PO TABS: 20.0000 mg | ORAL_TABLET | Freq: Every day | ORAL | 6 refills | Status: DC

## 2018-09-09 NOTE — H&P (View-Only) (Signed)
Cardiology Consultation:   Patient ID: [name] MRN: [id number]; DOB: 07/28/1935  Admit date: 09/09/2018 Date of Consult: 09/09/2018  Primary Care Provider: [facility], [facility], MD Primary Cardiologist/Primary Electrophysiologist:  Dr. Klein, last 2010   Patient Profile:   [name] is a 83 y.o. male with a hx of CAD, cardiac arrest w/ICD, HTN, HLD, BPH who is being seen today for the evaluation of ICD errosion at the request of Dr. Pickering.  Device information: MDT single chamber ICD, implanted 04/19/08, secondary prevention  History of Present Illness:   [name] was historically follow by Dr. Klein, last in 2010 after which he moved out of the country to the [place]. He speaks English, tells me he understands well if I speak slow.  We are able to communicate.    He reports that he went to visit family and decided to stay.  He reports medical care, not sure how often.  [place] with concerns of his ICD being exposed.  He says he saw a doctor about 1-2 months ago, was told his device was no longer working but no recommendations regarding the erosion of the device which he says has been on-going for about 4 months.  Outside of this he has no complaints.  Denies pain at the device.  Denies symptoms of fever or illness, no CP, palpitations or SOB.  He states he has never been shocked, no reports of syncope.  LABS K+ 3.4 BUN/Creat 20/1.44 WBC 8.5 H/H 11/37Plts 198 VSS/afebrile  Past Medical History:  Diagnosis Date  . Benign prostatic hypertrophy   . Coronary artery disease    LAst LHC in 6/09 showed to LAD, To mRCA, to CFX. SVG=-RCA patent., SVG-D patent, SVG-OM2 2 patent, LIMA-LAD patent. EF 40-45%  . GERD (gastroesophageal reflux disease)   . Hyperlipemia   . Hypertension   . Inguinal hernia   . Personal history of colonic polyps   . Right knee DJD   . Ventricular fibrillation (HCC) 6/09   scar related as no  acute ischemia. Patient has Medtronic ICD    Past Surgical History:  Procedure Laterality Date  . CORONARY ARTERY BYPASS GRAFT  2001  . inguinal herniorrhaphy, right  1959  . turp (otheR)  7/08   Dr Wren     Home Medications:  Prior to Admission medications   Medication Sig Start Date End Date Taking? Authorizing Provider  acetaminophen (TYLENOL) 325 MG tablet Take 650 mg by mouth as needed.      [provider]  aspirin (ECOTRIN LOW STRENGTH) 81 MG EC tablet Take 81 mg by mouth daily.      [provider]  esomeprazole (NEXIUM) 40 MG capsule Take 40 mg by mouth daily as needed.      [provider]  metoprolol tartrate (LOPRESSOR) 25 MG tablet Take 25 mg by mouth daily. 1/2 tab in am    [provider]  nitroGLYCERIN (NITROSTAT) 0.4 MG SL tablet Place 0.4 mg under the tongue as needed.      [provider]  rosuvastatin (CRESTOR) 20 MG tablet Take 20 mg by mouth daily.      [provider]  carvedilol (COREG) 6.25 MG tablet Take 6.25 mg by mouth 2 (two) times daily.    01/17/12  [provider]    Inpatient Medications: Scheduled Meds:  Continuous Infusions:  PRN Meds:   Allergies:   No Known Allergies  Social History:   Social History     Socioeconomic History  . Marital status: Married    Spouse name: Not on file  . Number of children: Not on file  . Years of education: Not on file  . Highest education level: Not on file  Occupational History  . Not on file  Social Needs  . Financial resource strain: Not on file  . Food insecurity:    Worry: Not on file    Inability: Not on file  . Transportation needs:    Medical: Not on file    Non-medical: Not on file  Tobacco Use  . Smoking status: Never Smoker  Substance and Sexual Activity  . Alcohol use: No  . Drug use: Not on file  . Sexual activity: Not on file  Lifestyle  . Physical activity:    Days per week: Not on file    Minutes per session: Not on  file  . Stress: Not on file  Relationships  . Social connections:    Talks on phone: Not on file    Gets together: Not on file    Attends religious service: Not on file    Active member of club or organization: Not on file    Attends meetings of clubs or organizations: Not on file    Relationship status: Not on file  . Intimate partner violence:    Fear of current or ex partner: Not on file    Emotionally abused: Not on file    Physically abused: Not on file    Forced sexual activity: Not on file  Other Topics Concern  . 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 Limited english/from [place]. Lives with daughter.     Family History:   The patient denies any known cardiac family history   ROS:  Please see the history of present illness.  All other ROS reviewed and negative.     Physical Exam/Data:   Vitals:   09/09/18 0942 09/09/18 1000 09/09/18 1045 09/09/18 1115  BP: 140/87 (!) 153/76 (!) 145/78 (!) 157/82  Pulse: 82 62 (!) 53 (!) 58  Resp: 16 (!) 22 18 (!) 23  Temp: 98.3 F (36.8 C)     TempSrc: Oral     SpO2: 99% 99% 100% 100%  Weight: 54.4 kg     Height: 5' 5" (1.651 m)      No intake or output data in the 24 hours ending 09/09/18 1153 Filed Weights   09/09/18 0942  Weight: 54.4 kg   Body mass index is 19.97 kg/m.  General:  Well nourished, well developed, very thin body habitusin no acute distress HEENT: normal Lymph: no adenopathy Neck: no JVD Endocrine:  No thryomegaly Vascular: No carotid bruits Cardiac:  RRR; no murmurs, gallops or rubs Lungs:  CTA b/l, no wheezing, rhonchi or rales  Abd: soft, nontender Ext: no edema Musculoskeletal:  No deformities, age appropriate atrophy Skin: warm and dry  Neuro:  No gross focal abnormalities noted Psych:  Normal affect  Left chest: ICD is eroded through the skin, no drainage, no heat    EKG:  The EKG was personally reviewed and demonstrates:   SB, 53bpm, IRBBB,  108ms Telemetry:  Telemetry was personally reviewed and demonstrates:   SR  Relevant CV Studies:  11/21/09; TTE Study Conclusions - Left ventricle: The cavity size was normal. Systolic function was  normal. The estimated ejection fraction was in the range of 50% to  55%. Wall motion was normal; there were no regional   wall motion  abnormalities. Doppler parameters are consistent with abnormal  left ventricular relaxation (grade 1 diastolic dysfunction). - Aortic valve: Mild regurgitation. - Mitral valve: Mild regurgitation.  Laboratory Data:  Chemistry Recent Labs  Lab 09/09/18 1000  NA 141  K 3.4*  CL 109  CO2 24  GLUCOSE 138*  BUN 20  CREATININE 1.44*  CALCIUM 9.6  GFRNONAA 44*  GFRAA 51*  ANIONGAP 8    No results for input(s): PROT, ALBUMIN, AST, ALT, ALKPHOS, BILITOT in the last 168 hours. Hematology Recent Labs  Lab 09/09/18 1000  WBC 8.5  RBC 4.09*  HGB 11.6*  HCT 37.2*  MCV 91.0  MCH 28.4  MCHC 31.2  RDW 14.0  PLT 198   Cardiac EnzymesNo results for input(s): TROPONINI in the last 168 hours. No results for input(s): TROPIPOC in the last 168 hours.  BNPNo results for input(s): BNP, PROBNP in the last 168 hours.  DDimer No results for input(s): DDIMER in the last 168 hours.  Radiology/Studies:   Dg Chest Portable 1 View Result Date: 09/09/2018 CLINICAL DATA:  Defibrillator projecting from chest wall EXAM: PORTABLE CHEST 1 VIEW COMPARISON:  11/17/2009 FINDINGS: Defibrillator unit is again identified and stable. Postsurgical changes are again seen. Cardiac shadow is at the upper limits of normal in size. The lungs are well aerated bilaterally. No focal infiltrate or sizable effusion is seen. No acute bony abnormality is noted. IMPRESSION: No change from the prior exam. Electronically Signed   By: Mark  Lukens M.D.   On: 09/09/2018 10:17    Assessment and Plan:   1. ICD erosion     No symptoms of illness, afebrile     Will need device  extraction  2. CAD     Denies any symptoms   3. Hx of cardiac arrest (seems associated with the time of his CABG)     Pt denies any syncope or shock history  Device was checked, at EOL, tachy therapies were disabled given device lead exposure, back up brady pacing at 40bpm, <0.1% VP history  Dr. Taylormarie Register has seen/examined the patient Will give prophylactic keflex No need to admit given patient does no appear ill, afebrile, normal WBC TEE at time of extraction, no need for echo pre-op Blood cultures today prior to leaving ER  I have discussed with the patient and his daughter Ruby Dr. Amelia Burgard's nurse will call to schedule his surgery and provide instructions, our office information is provided in the AVS.    I have staff messaged Dr. Crystalle Popwell's nurse, the information inclulding the daughter's contact information Ruby Cole Cell [phone] Home [phone]     For questions or updates, please contact [facility] Please consult [email or web address] for contact info under     Signed, Renee Lynn Ursuy, PA-C  09/09/2018 11:53 AM  EP Attending  Patient seen and examined. Agree with above. He is not ill and has had no signs of systemic infection. His device is at EOL and the device itself is sticking through the skin. We will schedule him to undergo ICD system extraction as our schedule allows next week. If he should become ill with symptoms of infection, he is instructed to return to the hospital.  Shonice Wrisley,M.D. 

## 2018-09-09 NOTE — ED Notes (Signed)
Dr. Ladona Ridgel bedside to assess and plan care.

## 2018-09-09 NOTE — ED Notes (Signed)
Medtronic technician bedside to attempt pacemaker interrogation.

## 2018-09-09 NOTE — Discharge Instructions (Signed)
Do not remove dressing.  If it falls off, no need to replace.  You will be called by Dr. Lubertha Basque nurse tomorrow to schedule your surgery.   If you do not hear from her Boneta Lucks or [place]) by the end of tomorrow, please call our office to follow up and schedule surgery and get your procedure instructions

## 2018-09-09 NOTE — ED Notes (Signed)
Pt's family wishing to speak with social work with questions regarding health insurance and whether or not the pt meets criteria for Medicaid. States pt already has Medicare.

## 2018-09-09 NOTE — H&P (Deleted)
Please see Consultation report today to serve as H&P  Francis Dowse, PA-C

## 2018-09-09 NOTE — ED Notes (Signed)
Got patient undress on the monitor patient is resting with call bell in reach and nurse at bedside   

## 2018-09-09 NOTE — Consult Note (Addendum)
Cardiology Consultation:   Patient ID: [name] MRN: [id number]; DOB: 1935-09-17  Admit date: 09/09/2018 Date of Consult: 09/09/2018  Primary Care Provider: Pccm, Brent Gurney, MD Primary Cardiologist/Primary Electrophysiologist:  Dr. Graciela [name], last 2010   Patient Profile:   [name] is a 83 y.o. male with a hx of CAD, cardiac arrest w/ICD, HTN, HLD, BPH who is being seen today for the evaluation of ICD errosion at the request of Dr. Rubin [name].  Device information: MDT single chamber ICD, implanted 04/19/08, secondary prevention  History of Present Illness:   [name] was historically follow by Dr. Graciela [name], last in 2010 after which he moved out of the country to the [place] He speaks [place], tells me he understands well if I speak slow.  We are able to communicate.    He reports that he went to visit family and decided to stay.  He reports medical care, not sure how often.  [place] concerns of his ICD being exposed.  He says he saw a doctor about 1-2 months ago, was told his device was no longer working but no recommendations regarding the erosion of the device which he says has been on-going for about 4 months.  Outside of this he has no complaints.  Denies pain at the device.  Denies symptoms of fever or illness, no CP, palpitations or SOB.  He states he has never been shocked, no reports of syncope.  LABS K+ 3.4 BUN/Creat 20/1.44 WBC 8.5 H/H 11/37Plts 198 VSS/afebrile  Past Medical History:  Diagnosis Date  . Benign prostatic hypertrophy   . Coronary artery disease    LAst LHC in 6/09 showed to LAD, To mRCA, to CFX. SVG=-RCA patent., SVG-D patent, SVG-OM2 2 patent, LIMA-LAD patent. EF 40-45%  . GERD (gastroesophageal reflux disease)   . Hyperlipemia   . Hypertension   . Inguinal hernia   . Personal history of colonic polyps   . Right knee DJD   . Ventricular fibrillation (HCC) 6/09   scar related as no  acute ischemia. Patient has Medtronic ICD    Past Surgical History:  Procedure Laterality Date  . CORONARY ARTERY BYPASS GRAFT  2001  . inguinal herniorrhaphy, right  1959  . turp (otheR)  7/08   Dr [name]     Home Medications:  Prior to Admission medications   Medication Sig Start Date End Date Taking? Authorizing Provider  acetaminophen (TYLENOL) 325 MG tablet Take 650 mg by mouth as needed.      [provider]  aspirin (ECOTRIN LOW STRENGTH) 81 MG EC tablet Take 81 mg by mouth daily.      [provider]  esomeprazole (NEXIUM) 40 MG capsule Take 40 mg by mouth daily as needed.      [provider]  metoprolol tartrate (LOPRESSOR) 25 MG tablet Take 25 mg by mouth daily. 1/2 tab in am    [provider]  nitroGLYCERIN (NITROSTAT) 0.4 MG SL tablet Place 0.4 mg under the tongue as needed.      [provider]  rosuvastatin (CRESTOR) 20 MG tablet Take 20 mg by mouth daily.      [provider]  carvedilol (COREG) 6.25 MG tablet Take 6.25 mg by mouth 2 (two) times daily.    01/17/12  [provider]    Inpatient Medications: Scheduled Meds:  Continuous Infusions:  PRN Meds:   Allergies:   No Known Allergies  Social History:   Social History  Socioeconomic History  . Marital status: Married    Spouse name: Not on file  . Number of children: Not on file  . Years of education: Not on file  . Highest education level: Not on file  Occupational History  . Not on file  Social Needs  . Financial resource strain: Not on file  . Food insecurity:    Worry: Not on file    Inability: Not on file  . Transportation needs:    Medical: Not on file    Non-medical: Not on file  Tobacco Use  . Smoking status: Never Smoker  Substance and Sexual Activity  . Alcohol use: No  . Drug use: Not on file  . Sexual activity: Not on file  Lifestyle  . Physical activity:    Days per week: Not on file    Minutes per session: Not on  file  . Stress: Not on file  Relationships  . Social connections:    Talks on phone: Not on file    Gets together: Not on file    Attends religious service: Not on file    Active member of club or organization: Not on file    Attends meetings of clubs or organizations: Not on file    Relationship status: Not on file  . Intimate partner violence:    Fear of current or ex partner: Not on file    Emotionally abused: Not on file    Physically abused: Not on file    Forced sexual activity: Not on file  Other Topics Concern  . 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 Limited english/from [place] Lives with daughter.     Family History:   The patient denies any known cardiac family history   ROS:  Please see the history of present illness.  All other ROS reviewed and negative.     Physical Exam/Data:   Vitals:   09/09/18 0942 09/09/18 1000 09/09/18 1045 09/09/18 1115  BP: 140/87 (!) 153/76 (!) 145/78 (!) 157/82  Pulse: 82 62 (!) 53 (!) 58  Resp: 16 (!) 22 18 (!) 23  Temp: 98.3 F (36.8 C)     TempSrc: Oral     SpO2: 99% 99% 100% 100%  Weight: 54.4 kg     Height: 5\' 5"  (1.651 m)      No intake or output data in the 24 hours ending 09/09/18 1153 Filed Weights   09/09/18 0942  Weight: 54.4 kg   Body mass index is 19.97 kg/m.  General:  Well nourished, well developed, very thin body habitusin no acute distress HEENT: normal Lymph: no adenopathy Neck: no JVD Endocrine:  No thryomegaly Vascular: No carotid bruits Cardiac:  RRR; no murmurs, gallops or rubs Lungs:  CTA b/l, no wheezing, rhonchi or rales  Abd: soft, nontender Ext: no edema Musculoskeletal:  No deformities, age appropriate atrophy Skin: warm and dry  Neuro:  No gross focal abnormalities noted Psych:  Normal affect  Left chest: ICD is eroded through the skin, no drainage, no heat    EKG:  The EKG was personally reviewed and demonstrates:   SB, 53bpm, IRBBB,  Telemetry:  Telemetry was personally reviewed and demonstrates:   SR  Relevant CV Studies:  11/21/09; TTE Study Conclusions - Left ventricle: The cavity size was normal. Systolic function was  normal. The estimated ejection fraction was in the range of 50% to  55%. Wall motion was normal; there were no regional  wall motion  abnormalities. Doppler parameters are consistent with abnormal  left ventricular relaxation (grade 1 diastolic dysfunction). - Aortic valve: Mild regurgitation. - Mitral valve: Mild regurgitation.  Laboratory Data:  Chemistry Recent Labs  Lab 09/09/18 1000  NA 141  K 3.4*  CL 109  CO2 24  GLUCOSE 138*  BUN 20  CREATININE 1.44*  CALCIUM 9.6  GFRNONAA 44*  GFRAA 51*  ANIONGAP 8    No results for input(s): PROT, ALBUMIN, AST, ALT, ALKPHOS, BILITOT in the last 168 hours. Hematology Recent Labs  Lab 09/09/18 1000  WBC 8.5  RBC 4.09*  HGB 11.6*  HCT 37.2*  MCV 91.0  MCH 28.4  MCHC 31.2  RDW 14.0  PLT 198   Cardiac EnzymesNo results for input(s): TROPONINI in the last 168 hours. No results for input(s): TROPIPOC in the last 168 hours.  BNPNo results for input(s): BNP, PROBNP in the last 168 hours.  DDimer No results for input(s): DDIMER in the last 168 hours.  Radiology/Studies:   Dg Chest Portable 1 View Result Date: 09/09/2018 CLINICAL DATA:  Defibrillator projecting from chest wall EXAM: PORTABLE CHEST 1 VIEW COMPARISON:  11/17/2009 FINDINGS: Defibrillator unit is again identified and stable. Postsurgical changes are again seen. Cardiac shadow is at the upper limits of normal in size. The lungs are well aerated bilaterally. No focal infiltrate or sizable effusion is seen. No acute bony abnormality is noted. IMPRESSION: No change from the prior exam. Electronically Signed   By: [name] M.D.   On: 09/09/2018 10:17    Assessment and Plan:   1. ICD erosion     No symptoms of illness, afebrile     Will need device  extraction  2. CAD     Denies any symptoms   3. Hx of cardiac arrest (seems associated with the time of his CABG)     Pt denies any syncope or shock history  Device was checked, at EOL, tachy therapies were disabled given device lead exposure, back up brady pacing at 40bpm, <0.1% VP history  [name] has seen/examined the patient Will give prophylactic keflex No need to admit given patient does no appear ill, afebrile, normal WBC TEE at time of extraction, no need for echo pre-op Blood cultures today prior to leaving ER  I have discussed with the patient and his daughter [name] Dr. Lubertha Basque nurse will call to schedule his surgery and provide instructions, our office information is provided in the AVS.    I have staff messaged Dr. Lubertha Basque nurse, the information inclulding the daughter's contact information [name] Cell [phone] Home [phone]     For questions or updates, please contact [facility] Please consult [email or web address] for contact info under     Signed, Sheilah Pigeon, PA-C  09/09/2018 11:53 AM  EP Attending  Patient seen and examined. Agree with above. He is not ill and has had no signs of systemic infection. His device is at EOL and the device itself is sticking through the skin. We will schedule him to undergo ICD system extraction as our schedule allows next week. If he should become ill with symptoms of infection, he is instructed to return to the hospital.  Leonia Reeves.D.

## 2018-09-09 NOTE — ED Provider Notes (Signed)
[facility] EMERGENCY DEPARTMENT Provider Note   CSN: [id number] Arrival date & time: 09/09/18  4098     History   Chief Complaint Chief Complaint  Patient presents with  . Pacemaker Problem    HPI MILTON SAGONA is a 83 y.o. male.  HPI Patient presents with AICD problem.  AICD is sticking out of his left chest wall.  Report he has been this way for a few months.  He is just returned from the [place]  Somewhat difficult to get a history but says he did see someone for there.  Reviewed records and appears to be an AICD for ventricular fibrillation related to scar.  Previously seen by Dr. Graciela Husbands.  He however is lived in the [place] for the last 8 years. Past Medical History:  Diagnosis Date  . Benign prostatic hypertrophy   . Coronary artery disease    LAst LHC in 6/09 showed to LAD, To mRCA, to CFX. SVG=-RCA patent., SVG-D patent, SVG-OM2 2 patent, LIMA-LAD patent. EF 40-45%  . GERD (gastroesophageal reflux disease)   . Hyperlipemia   . Hypertension   . Inguinal hernia   . Personal history of colonic polyps   . Right knee DJD   . Ventricular fibrillation (HCC) 6/09   scar related as no acute ischemia. Patient has Medtronic ICD    Patient Active Problem List   Diagnosis Date Noted  . CARDIOMYOPATHY, ISCHEMIC 11/15/2009  . PREMATURE VENTRICULAR CONTRACTIONS 10/04/2009  . INGUINAL HERNIA, RECURRENT 09/21/2009  . ABDOMINAL PAIN 09/21/2009  . VENTRICULAR FIBRILLATION 04/05/2009  . HYPERLIPIDEMIA 03/24/2008  . BENIGN PROSTATIC HYPERTROPHY 03/24/2008  . COLONIC POLYPS, HX OF 03/24/2008  . GERD 08/04/2007  . INGUINAL HERNIA, RIGHT 08/04/2007  . DEGENERATIVE JOINT DISEASE 08/04/2007    Past Surgical History:  Procedure Laterality Date  . CORONARY ARTERY BYPASS GRAFT  2001  . inguinal herniorrhaphy, right  1959  . turp (otheR)  7/08   Dr Wilson Singer        Home Medications    Prior to Admission medications   Medication Sig Start Date  End Date Taking? Authorizing Provider  acetaminophen (TYLENOL) 325 MG tablet Take 650 mg by mouth as needed.      [provider]  aspirin (ECOTRIN LOW STRENGTH) 81 MG EC tablet Take 81 mg by mouth daily.      [provider]  esomeprazole (NEXIUM) 40 MG capsule Take 40 mg by mouth daily as needed.      [provider]  metoprolol tartrate (LOPRESSOR) 25 MG tablet Take 25 mg by mouth daily. 1/2 tab in am    [provider]  nitroGLYCERIN (NITROSTAT) 0.4 MG SL tablet Place 0.4 mg under the tongue as needed.      [provider]  rosuvastatin (CRESTOR) 20 MG tablet Take 20 mg by mouth daily.      [provider]  carvedilol (COREG) 6.25 MG tablet Take 6.25 mg by mouth 2 (two) times daily.    01/17/12  [provider]    Family History No family history on file.  Social History Social History   Tobacco Use  . Smoking status: Never Smoker  Substance Use Topics  . Alcohol use: No  . Drug use: Not on file     Allergies   Patient has no known allergies.   Review of Systems Review of Systems   Physical Exam Updated Vital Signs BP (!) 153/76   Pulse 62   Temp 98.3 F (36.8  C) (Oral)   Resp (!) 22   Ht 5\' 5"  (1.651 m)   Wt 54.4 kg   SpO2 99%   BMI 19.97 kg/m   Physical Exam  Constitutional: He appears well-developed.  HENT:  Head: Normocephalic.  Eyes: Pupils are equal, round, and reactive to light.  Neck: Neck supple.  Cardiovascular: Normal rate.  Pulmonary/Chest:  AICD generator and wire protruding from skin on left chest wall.  Scar from previous median sternotomy.  Abdominal: There is no tenderness.  Musculoskeletal: He exhibits no tenderness.  Neurological: He is alert.  Skin: Skin is warm. Capillary refill takes less than 2 seconds.       ED Treatments / Results  Labs (all labs ordered are listed, but only abnormal results are displayed) Labs Reviewed  PROTIME-INR  CBC WITH  DIFFERENTIAL/PLATELET  BASIC METABOLIC PANEL    EKG None  Radiology No results found.  Procedures Procedures (including critical care time)  Medications Ordered in ED Medications - No data to display   Initial Impression / Assessment and Plan / ED Course  I have reviewed the triage vital signs and the nursing notes.  Pertinent labs & imaging results that were available during my care of the patient were reviewed by me and considered in my medical decision making (see chart for details).     Patient resents with exposed to generator and wire from AICD.  Reportedly has been exposed for 4 months.  Discussed with cardiology and EP will come see patient.  EP has seen patient.  Initially they thought he was going to be admitted but now plan outpatient placement of new AICD.  Cultures drawn.  Antibiotic started.  Discharge home.  Final Clinical Impressions(s) / ED Diagnoses   Final diagnoses:  Aicd mechanical complication    ED Discharge Orders    None       Benjiman Core, MD 09/09/18 1417

## 2018-09-09 NOTE — ED Triage Notes (Signed)
Pt has been in [place] since 2010. He gets medical care there. Pacemaker placed at [facility] with Dr. Graciela Husbands before he left [place]. Pacemaker is now protruding from chest wall. Posey Rea is still working. A&Ox4 denying pain.

## 2018-09-10 ENCOUNTER — Telehealth: Payer: Self-pay | Admitting: Internal Medicine

## 2018-09-10 NOTE — Telephone Encounter (Signed)
New message    per patient's  daughter would like a call to schedule for the removal of the defibulator. Please call to advise.

## 2018-09-10 NOTE — Telephone Encounter (Signed)
Call placed to daughter   Notified Pt is scheduled for system removal on September 18, 2018 at 3:00 pm.  Following instruction given:  Please arrive at the [facility] main entrance of [facility] hospital at:  1:00 pm on September 18, 2018 May have LIGHT breakfast before 7:00 am.  NO FOOD after 7:00 am May take normal morning meds with a sip of water Plan for one night stay You will need someone to drive you home at discharge  Daughter indicates understanding.

## 2018-09-12 NOTE — Pre-Procedure Instructions (Signed)
[name]  09/12/2018      [id number] [place], [place] - [address] AT [facility] [address] [facility] [address] [place] [address] Phone: [phone] Fax: [phone]    Your procedure is scheduled on 09-18-2018  Thursday   Report to [facility] Admitting at 1:00 pm   Call this number if you have problems the morning of surgery:  [phone]     May have light breakfast before 7:00 AM No food or drink after 7:00 AM                        Take these medicines the morning of surgery with A SIP OF WATER  Aspirin 81 mg Carvedilol(Coreg) Rovastatin(Crestor) Nitroglycerin if needed        Do not wear jewelry  Do not wear lotions, powders, or perfumes, or deodorant.  Do not shave 48 hours prior to surgery.  Men may shave face and neck.  Do not bring valuables to the hospital.  [facility] is not responsible for any belongings or valuables.  Contacts, dentures or bridgework may not be worn into surgery.  Leave your suitcase in the car.  After surgery it may be brought to your room.  For patients admitted to the hospital, discharge time will be determined by your treatment team.  Patients discharged the day of surgery will not be allowed to drive home.    [place] - Preparing for Surgery  Before surgery, you can play an important role.  Because skin is not sterile, your skin needs to be as free of germs as possible.  You can reduce the number of germs on you skin by washing with CHG (chlorahexidine gluconate) soap before surgery.  CHG is an antiseptic cleaner which kills germs and bonds with the skin to continue killing germs even after washing.  Oral Hygiene is also important in reducing the risk of infection.  Remember to brush your teeth with your regular toothpaste the morning of surgery.  Please DO NOT use if you have an allergy to CHG or antibacterial soaps.  If your skin becomes reddened/irritated stop  using the CHG and inform your nurse when you arrive at Short Stay.  Do not shave (including legs and underarms) for at least 48 hours prior to the first CHG shower.  You may shave your face.  Please follow these instructions carefully:   1.  Shower with CHG Soap the night before surgery and the morning of Surgery.  2.  If you choose to wash your hair, wash your hair first as usual with your normal shampoo.  3.  After you shampoo, rinse your hair and body thoroughly to remove the shampoo. 4.  Use CHG as you would any other liquid soap.  You can apply chg directly to the skin and wash gently with a      scrungie or washcloth.           5.  Apply the CHG Soap to your body ONLY FROM THE NECK DOWN.   Do not use on open wounds or open sores. Avoid contact with your eyes, ears, mouth and genitals (private parts).  Wash genitals (private parts) with your normal soap.  6.  Wash thoroughly, paying special attention to the area where your surgery will be performed.  7.  Thoroughly rinse your body with warm water from the neck down.  8.  DO NOT shower/wash with your normal soap after using and rinsing off the CHG Soap.  9.  Pat yourself dry with a clean towel.            10.  Wear clean pajamas.            11.  Place clean sheets on your bed the night of your first shower and do not sleep with pets.  Day of Surgery  Do not apply any lotions/deoderants the morning of surgery.   Please wear clean clothes to the hospital/surgery center. Remember to brush your teeth with toothpaste.    Please read over the following fact sheets that you were given. Pain Booklet and Surgical Site Infection Prevention

## 2018-09-14 LAB — CULTURE, BLOOD (ROUTINE X 2)
CULTURE: NO GROWTH
Culture: NO GROWTH
Special Requests: ADEQUATE
Special Requests: ADEQUATE

## 2018-09-15 ENCOUNTER — Encounter (HOSPITAL_COMMUNITY)
Admission: RE | Admit: 2018-09-15 | Discharge: 2018-09-15 | Disposition: A | Discharge status: Home / Self Care | Payer: Medicare Other | Source: Ambulatory Visit | Attending: Internal Medicine | Admitting: Internal Medicine

## 2018-09-15 ENCOUNTER — Other Ambulatory Visit: Payer: Self-pay

## 2018-09-15 ENCOUNTER — Encounter (HOSPITAL_COMMUNITY): Payer: Self-pay

## 2018-09-15 DIAGNOSIS — Z01812 Encounter for preprocedural laboratory examination: Secondary | ICD-10-CM | POA: Diagnosis present

## 2018-09-15 HISTORY — DX: Frequency of micturition: R35.0

## 2018-09-15 HISTORY — DX: Anxiety disorder, unspecified: F41.9

## 2018-09-15 LAB — ABO/RH: ABO/RH(D): O POS

## 2018-09-15 LAB — PREPARE RBC (CROSSMATCH)

## 2018-09-15 LAB — SURGICAL PCR SCREEN
MRSA, PCR: NEGATIVE
Staphylococcus aureus: NEGATIVE

## 2018-09-15 NOTE — Progress Notes (Signed)
Peri-operative device orders  Faxed to [employer].  E-Mail sent to Jana Half and Hart Robinsons @ [employer].  E-mail sent to [employer] RN Informing all of date and time of pt. Surgery.  Pt. And wife just returned from [place] last week.  Pt. Understands and speaks some English,daughter here speaks fluent [place].

## 2018-09-16 NOTE — Anesthesia Preprocedure Evaluation (Addendum)
Anesthesia Evaluation  Patient identified by MRN, date of birth, ID band Patient awake    Reviewed: Allergy & Precautions, NPO status , Patient's Chart, lab work & pertinent test results, reviewed documented beta blocker date and time   History of Anesthesia Complications Negative for: history of anesthetic complications  Airway Mallampati: I  TM Distance: >3 FB Neck ROM: Full    Dental  (+) Edentulous Upper, Edentulous Lower   Pulmonary neg pulmonary ROS,    breath sounds clear to auscultation       Cardiovascular hypertension, Pt. on medications and Pt. on home beta blockers (-) angina+ CAD and + CABG  + dysrhythmias Ventricular Fibrillation + Cardiac Defibrillator (device has never shocked patient, is now eroding through skin)  Rhythm:Regular Rate:Bradycardia  '13 Stress: EF 47%, no ischemia '11 ECHO: EF 50-55%, mild MR, mild AI   Neuro/Psych Anxiety negative neurological ROS     GI/Hepatic negative GI ROS, Neg liver ROS,   Endo/Other  negative endocrine ROS  Renal/GU Renal InsufficiencyRenal disease (creat 1.44)     Musculoskeletal  (+) Arthritis ,   Abdominal   Peds  Hematology Hb 11.6, plt 198k   Anesthesia Other Findings   Reproductive/Obstetrics                           Anesthesia Physical Anesthesia Plan  ASA: III  Anesthesia Plan: General   Post-op Pain Management:    Induction: Intravenous  PONV Risk Score and Plan: 2 and Ondansetron and Dexamethasone  Airway Management Planned: Oral ETT  Additional Equipment: Arterial line and TEE  Intra-op Plan:   Post-operative Plan: Extubation in OR  Informed Consent: I have reviewed the patients History and Physical, chart, labs and discussed the procedure including the risks, benefits and alternatives for the proposed anesthesia with the patient or authorized representative who has indicated his/her understanding and  acceptance.   Dental advisory given  Plan Discussed with:   Anesthesia Plan Comments: (AICD generator and wire protruding from skin on left chest wall. See note from Dr. Ladona Ridgel 09/09/18. Plan routine monitors, A line, GETA with TEE)       Anesthesia Quick Evaluation

## 2018-09-17 MED ORDER — CEFAZOLIN SODIUM-DEXTROSE 2-4 GM/100ML-% IV SOLN
2.0000 g | INTRAVENOUS | Status: AC
  Administered 2018-09-18: 2 g via INTRAVENOUS
  Filled 2018-09-17: qty 100

## 2018-09-17 MED ORDER — SODIUM CHLORIDE 0.9 % IV SOLN
80.0000 mg | INTRAVENOUS | Status: AC
  Administered 2018-09-18: 80 mg
  Filled 2018-09-17: qty 80

## 2018-09-18 ENCOUNTER — Encounter (HOSPITAL_COMMUNITY): Payer: Self-pay | Admitting: *Deleted

## 2018-09-18 ENCOUNTER — Inpatient Hospital Stay (HOSPITAL_COMMUNITY): Payer: Medicare Other | Admitting: Physician Assistant

## 2018-09-18 ENCOUNTER — Inpatient Hospital Stay (HOSPITAL_COMMUNITY): Payer: Medicare Other

## 2018-09-18 ENCOUNTER — Ambulatory Visit (HOSPITAL_COMMUNITY)
Admission: RE | Admit: 2018-09-18 | Discharge: 2018-09-20 | Disposition: A | Discharge status: Home / Self Care | Payer: Medicare Other | Source: Ambulatory Visit | Attending: Internal Medicine | Admitting: Internal Medicine

## 2018-09-18 ENCOUNTER — Inpatient Hospital Stay (HOSPITAL_COMMUNITY): Payer: Medicare Other | Admitting: Registered Nurse

## 2018-09-18 ENCOUNTER — Encounter (HOSPITAL_COMMUNITY)
Admission: RE | Disposition: A | Discharge status: Home / Self Care | Payer: Self-pay | Source: Ambulatory Visit | Attending: Internal Medicine

## 2018-09-18 DIAGNOSIS — Z79899 Other long term (current) drug therapy: Secondary | ICD-10-CM | POA: Diagnosis not present

## 2018-09-18 DIAGNOSIS — Z951 Presence of aortocoronary bypass graft: Secondary | ICD-10-CM | POA: Insufficient documentation

## 2018-09-18 DIAGNOSIS — Y838 Other surgical procedures as the cause of abnormal reaction of the patient, or of later complication, without mention of misadventure at the time of the procedure: Secondary | ICD-10-CM | POA: Insufficient documentation

## 2018-09-18 DIAGNOSIS — I10 Essential (primary) hypertension: Secondary | ICD-10-CM | POA: Diagnosis not present

## 2018-09-18 DIAGNOSIS — E785 Hyperlipidemia, unspecified: Secondary | ICD-10-CM | POA: Insufficient documentation

## 2018-09-18 DIAGNOSIS — Z7982 Long term (current) use of aspirin: Secondary | ICD-10-CM | POA: Diagnosis not present

## 2018-09-18 DIAGNOSIS — I2510 Atherosclerotic heart disease of native coronary artery without angina pectoris: Secondary | ICD-10-CM | POA: Insufficient documentation

## 2018-09-18 DIAGNOSIS — N400 Enlarged prostate without lower urinary tract symptoms: Secondary | ICD-10-CM | POA: Diagnosis not present

## 2018-09-18 DIAGNOSIS — T827XXA Infection and inflammatory reaction due to other cardiac and vascular devices, implants and grafts, initial encounter: Principal | ICD-10-CM | POA: Insufficient documentation

## 2018-09-18 DIAGNOSIS — Z8674 Personal history of sudden cardiac arrest: Secondary | ICD-10-CM | POA: Insufficient documentation

## 2018-09-18 DIAGNOSIS — T827XXS Infection and inflammatory reaction due to other cardiac and vascular devices, implants and grafts, sequela: Secondary | ICD-10-CM

## 2018-09-18 HISTORY — PX: GENERATOR REMOVAL: SHX5468

## 2018-09-18 HISTORY — PX: ICD LEAD REMOVAL: SHX5855

## 2018-09-18 LAB — ECHO TEE
Height: 65 in
Weight: 1844.81 oz

## 2018-09-18 SURGERY — REMOVAL, PULSE GENERATOR, ICD
Anesthesia: General | Site: Chest

## 2018-09-18 MED ORDER — LIDOCAINE HCL (PF) 1 % IJ SOLN
INTRAMUSCULAR | Status: AC
  Filled 2018-09-18: qty 30

## 2018-09-18 MED ORDER — SODIUM CHLORIDE 0.9 % IV SOLN: INTRAVENOUS | Status: DC

## 2018-09-18 MED ORDER — ONDANSETRON HCL 4 MG/2ML IJ SOLN
INTRAMUSCULAR | Status: AC
  Filled 2018-09-18: qty 4

## 2018-09-18 MED ORDER — ONDANSETRON HCL 4 MG/2ML IJ SOLN: 4.0000 mg | Freq: Four times a day (QID) | INTRAMUSCULAR | Status: DC | PRN

## 2018-09-18 MED ORDER — CEFAZOLIN SODIUM-DEXTROSE 1-4 GM/50ML-% IV SOLN
1.0000 g | Freq: Four times a day (QID) | INTRAVENOUS | Status: AC
  Administered 2018-09-18 – 2018-09-19 (×3): 1 g via INTRAVENOUS
  Filled 2018-09-18 (×3): qty 50

## 2018-09-18 MED ORDER — FENTANYL CITRATE (PF) 100 MCG/2ML IJ SOLN
25.0000 ug | INTRAMUSCULAR | Status: DC | PRN
  Administered 2018-09-18 (×2): 25 ug via INTRAVENOUS

## 2018-09-18 MED ORDER — SODIUM CHLORIDE 0.9 % IV SOLN
INTRAVENOUS | Status: DC | PRN
  Administered 2018-09-18: 500 mL

## 2018-09-18 MED ORDER — MIDAZOLAM HCL 5 MG/5ML IJ SOLN
INTRAMUSCULAR | Status: DC | PRN
  Administered 2018-09-18: 2 mg via INTRAVENOUS

## 2018-09-18 MED ORDER — ROCURONIUM BROMIDE 50 MG/5ML IV SOSY
PREFILLED_SYRINGE | INTRAVENOUS | Status: AC
  Filled 2018-09-18: qty 5

## 2018-09-18 MED ORDER — PROPOFOL 10 MG/ML IV BOLUS
INTRAVENOUS | Status: AC
  Filled 2018-09-18: qty 20

## 2018-09-18 MED ORDER — EPHEDRINE 5 MG/ML INJ
INTRAVENOUS | Status: AC
  Filled 2018-09-18: qty 10

## 2018-09-18 MED ORDER — SUGAMMADEX SODIUM 200 MG/2ML IV SOLN
INTRAVENOUS | Status: DC | PRN
  Administered 2018-09-18 (×3): 50 mg via INTRAVENOUS

## 2018-09-18 MED ORDER — MIDAZOLAM HCL 2 MG/2ML IJ SOLN
INTRAMUSCULAR | Status: AC
  Filled 2018-09-18: qty 2

## 2018-09-18 MED ORDER — ONDANSETRON HCL 4 MG/2ML IJ SOLN
INTRAMUSCULAR | Status: DC | PRN
  Administered 2018-09-18: 4 mg via INTRAVENOUS

## 2018-09-18 MED ORDER — LACTATED RINGERS IV SOLN
INTRAVENOUS | Status: DC | PRN
  Administered 2018-09-18: 13:00:00 via INTRAVENOUS

## 2018-09-18 MED ORDER — LIDOCAINE 2% (20 MG/ML) 5 ML SYRINGE
INTRAMUSCULAR | Status: AC
  Filled 2018-09-18: qty 5

## 2018-09-18 MED ORDER — SODIUM CHLORIDE 0.9 % IV SOLN
INTRAVENOUS | Status: AC
  Filled 2018-09-18: qty 1.2

## 2018-09-18 MED ORDER — SUGAMMADEX SODIUM 200 MG/2ML IV SOLN
INTRAVENOUS | Status: AC
  Filled 2018-09-18: qty 2

## 2018-09-18 MED ORDER — DEXAMETHASONE SODIUM PHOSPHATE 10 MG/ML IJ SOLN
INTRAMUSCULAR | Status: AC
  Filled 2018-09-18: qty 1

## 2018-09-18 MED ORDER — FENTANYL CITRATE (PF) 100 MCG/2ML IJ SOLN
INTRAMUSCULAR | Status: AC
  Filled 2018-09-18: qty 2

## 2018-09-18 MED ORDER — SODIUM CHLORIDE 0.9 % IV SOLN
INTRAVENOUS | Status: DC | PRN
  Administered 2018-09-18: 20 ug/min via INTRAVENOUS

## 2018-09-18 MED ORDER — DEXAMETHASONE SODIUM PHOSPHATE 10 MG/ML IJ SOLN
INTRAMUSCULAR | Status: DC | PRN
  Administered 2018-09-18: 10 mg via INTRAVENOUS

## 2018-09-18 MED ORDER — LIDOCAINE 2% (20 MG/ML) 5 ML SYRINGE
INTRAMUSCULAR | Status: DC | PRN
  Administered 2018-09-18: 20 mg via INTRAVENOUS

## 2018-09-18 MED ORDER — PROPOFOL 10 MG/ML IV BOLUS
INTRAVENOUS | Status: DC | PRN
  Administered 2018-09-18: 120 mg via INTRAVENOUS

## 2018-09-18 MED ORDER — ACETAMINOPHEN 325 MG PO TABS
325.0000 mg | ORAL_TABLET | ORAL | Status: DC | PRN
  Administered 2018-09-18 – 2018-09-19 (×2): 650 mg via ORAL
  Filled 2018-09-18 (×2): qty 2

## 2018-09-18 MED ORDER — FENTANYL CITRATE (PF) 100 MCG/2ML IJ SOLN
INTRAMUSCULAR | Status: DC | PRN
  Administered 2018-09-18: 25 ug via INTRAVENOUS
  Administered 2018-09-18 (×2): 50 ug via INTRAVENOUS

## 2018-09-18 MED ORDER — ROCURONIUM BROMIDE 50 MG/5ML IV SOSY
PREFILLED_SYRINGE | INTRAVENOUS | Status: DC | PRN
  Administered 2018-09-18: 50 mg via INTRAVENOUS

## 2018-09-18 MED ORDER — LIDOCAINE HCL (PF) 1 % IJ SOLN
INTRAMUSCULAR | Status: DC | PRN
  Administered 2018-09-18: 30 mL

## 2018-09-18 MED ORDER — FENTANYL CITRATE (PF) 250 MCG/5ML IJ SOLN
INTRAMUSCULAR | Status: AC
  Filled 2018-09-18: qty 5

## 2018-09-18 SURGICAL SUPPLY — 54 items
BAG BANDED W/RUBBER/TAPE 36X54 (MISCELLANEOUS) ×3 IMPLANT
BAG DECANTER FOR FLEXI CONT (MISCELLANEOUS) ×3 IMPLANT
BLADE 10 SAFETY STRL DISP (BLADE) ×3 IMPLANT
BLADE CLIPPER SURG (BLADE) IMPLANT
BLADE OSCILLATING /SAGITTAL (BLADE) ×3 IMPLANT
BLADE STERNUM SYSTEM 6 (BLADE) IMPLANT
BNDG COHESIVE 4X5 WHT NS (GAUZE/BANDAGES/DRESSINGS) IMPLANT
CANISTER SUCT 3000ML PPV (MISCELLANEOUS) ×3 IMPLANT
COIL ONE TIE COMPRESSION (MISCELLANEOUS) ×3 IMPLANT
COVER BACK TABLE 60X90IN (DRAPES) ×3 IMPLANT
COVER WAND RF STERILE (DRAPES) ×3 IMPLANT
DRAPE C-ARM 42X72 X-RAY (DRAPES) IMPLANT
DRAPE CARDIOVASCULAR INCISE (DRAPES) ×1
DRAPE HALF SHEET 40X57 (DRAPES) ×6 IMPLANT
DRAPE INCISE IOBAN 66X45 STRL (DRAPES) ×3 IMPLANT
DRAPE SRG 135X102X78XABS (DRAPES) ×2 IMPLANT
DRSG TEGADERM 2-3/8X2-3/4 SM (GAUZE/BANDAGES/DRESSINGS) ×6 IMPLANT
ELECT REM PT RETURN 9FT ADLT (ELECTROSURGICAL) ×6
ELECTRODE REM PT RTRN 9FT ADLT (ELECTROSURGICAL) ×4 IMPLANT
FELT TEFLON 1X6 (MISCELLANEOUS) IMPLANT
GAUZE 4X4 16PLY RFD (DISPOSABLE) ×3 IMPLANT
GAUZE PACKING IODOFORM 1X5 (MISCELLANEOUS) ×3 IMPLANT
GAUZE SPONGE 2X2 8PLY STRL LF (GAUZE/BANDAGES/DRESSINGS) ×2 IMPLANT
GAUZE SPONGE 4X4 12PLY STRL (GAUZE/BANDAGES/DRESSINGS) ×3 IMPLANT
GAUZE SPONGE 4X4 12PLY STRL LF (GAUZE/BANDAGES/DRESSINGS) ×3 IMPLANT
GLOVE BIOGEL PI IND STRL 7.5 (GLOVE) ×2 IMPLANT
GLOVE BIOGEL PI INDICATOR 7.5 (GLOVE) ×1
GLOVE ECLIPSE 8.0 STRL XLNG CF (GLOVE) ×3 IMPLANT
GOWN STRL REUS W/ TWL LRG LVL3 (GOWN DISPOSABLE) ×4 IMPLANT
GOWN STRL REUS W/ TWL XL LVL3 (GOWN DISPOSABLE) ×2 IMPLANT
GOWN STRL REUS W/TWL LRG LVL3 (GOWN DISPOSABLE) ×2
GOWN STRL REUS W/TWL XL LVL3 (GOWN DISPOSABLE) ×1
KIT TURNOVER KIT B (KITS) ×3 IMPLANT
NEEDLE PERC 18GX7CM (NEEDLE) ×3 IMPLANT
NS IRRIG 1000ML POUR BTL (IV SOLUTION) IMPLANT
PAD ARMBOARD 7.5X6 YLW CONV (MISCELLANEOUS) ×6 IMPLANT
PAD ELECT DEFIB RADIOL ZOLL (MISCELLANEOUS) ×3 IMPLANT
PENCIL BUTTON HOLSTER BLD 10FT (ELECTRODE) IMPLANT
SHEATH EVOLUTION SHORTE RL 11F (SHEATH) ×3 IMPLANT
SNARE NEEDLE EYE 20MM RETRIEVL (CATHETERS) ×3 IMPLANT
SPONGE GAUZE 2X2 STER 10/PKG (GAUZE/BANDAGES/DRESSINGS) ×1
STYLET LIBERATOR LOCKING (MISCELLANEOUS) ×3 IMPLANT
SUT PROLENE 2 0 CT2 30 (SUTURE) ×6 IMPLANT
SUT PROLENE 2 0 SH DA (SUTURE) IMPLANT
SUT SILK  1 MH (SUTURE) ×1
SUT SILK 1 MH (SUTURE) ×2 IMPLANT
SUT VIC AB 2-0 CT2 18 VCP726D (SUTURE) IMPLANT
SUT VIC AB 3-0 X1 27 (SUTURE) IMPLANT
TAPE CLOTH SURG 4X10 WHT LF (GAUZE/BANDAGES/DRESSINGS) ×3 IMPLANT
TOWEL OR 17X24 6PK STRL BLUE (TOWEL DISPOSABLE) ×6 IMPLANT
TOWEL OR 17X26 10 PK STRL BLUE (TOWEL DISPOSABLE) ×6 IMPLANT
TRAY FOLEY MTR SLVR 16FR STAT (SET/KITS/TRAYS/PACK) ×3 IMPLANT
TUBE CONNECTING 12X1/4 (SUCTIONS) IMPLANT
YANKAUER SUCT BULB TIP NO VENT (SUCTIONS) IMPLANT

## 2018-09-18 NOTE — Transfer of Care (Signed)
Immediate Anesthesia Transfer of Care Note  Patient: [name]  Procedure(s) Performed: GENERATOR REMOVAL (Left Chest) ICD LEAD REMOVAL (N/A Chest)  Patient Location: PACU  Anesthesia Type:General  Level of Consciousness: awake and alert   Airway & Oxygen Therapy: Patient Spontanous Breathing and Patient connected to face mask oxygen  Post-op Assessment: Report given to RN and Post -op Vital signs reviewed and stable  Post vital signs: Reviewed and stable  Last Vitals:  Vitals Value Taken Time  BP    Temp    Pulse 62 09/18/2018  3:54 PM  Resp 13 09/18/2018  3:54 PM  SpO2 100 % 09/18/2018  3:54 PM  Vitals shown include unvalidated device data.  Last Pain:  Vitals:   09/18/18 1153  TempSrc:   PainSc: 0-No pain         Complications: No apparent anesthesia complications

## 2018-09-18 NOTE — Anesthesia Procedure Notes (Signed)
Procedure Name: Intubation Date/Time: 09/18/2018 1:45 PM Performed by: Trinna Post., CRNA Pre-anesthesia Checklist: Patient identified, Emergency Drugs available, Suction available, Patient being monitored and Timeout performed Patient Re-evaluated:Patient Re-evaluated prior to induction Oxygen Delivery Method: Circle system utilized Preoxygenation: Pre-oxygenation with 100% oxygen Induction Type: IV induction Ventilation: Mask ventilation without difficulty Laryngoscope Size: Mac and 4 Grade View: Grade I Tube type: Oral Tube size: 7.5 mm Number of attempts: 1 Airway Equipment and Method: Stylet Placement Confirmation: ETT inserted through vocal cords under direct vision,  positive ETCO2 and breath sounds checked- equal and bilateral Secured at: 21 cm Tube secured with: Tape Dental Injury: Teeth and Oropharynx as per pre-operative assessment

## 2018-09-18 NOTE — Progress Notes (Signed)
  Echocardiogram 2D Echocardiogram has been performed.  [name] 09/18/2018, 2:25 PM

## 2018-09-18 NOTE — Anesthesia Procedure Notes (Signed)
Arterial Line Insertion Start/End11/05/2018 12:30 PM Performed by: Laruth Bouchard., CRNA, CRNA  Patient location: Pre-op. Preanesthetic checklist: patient identified, IV checked, site marked, risks and benefits discussed, surgical consent, monitors and equipment checked, pre-op evaluation, timeout performed and anesthesia consent Lidocaine 1% used for infiltration Right, radial was placed Catheter size: 20 G Hand hygiene performed  and maximum sterile barriers used   Attempts: 1 Procedure performed without using ultrasound guided technique. Following insertion, dressing applied and Biopatch. Post procedure assessment: normal and unchanged  Patient tolerated the procedure well with no immediate complications.

## 2018-09-18 NOTE — Op Note (Signed)
EP procedure note   Preoperative diagnosis: ICD system infection  Postoperative diagnosis: Same as preoperative diagnosis  Procedure performed: Extraction of a single-chamber active-fixation defibrillator  Description of the procedure: After informed consent was obtained, the patient was taken to the operating room in the fasting state.  The anesthesia service was utilized to provide general endotracheal anesthesia as well as invasive hemodynamic monitoring with an arterial line placed in the right radial artery.  After the usual preparation and draping as well as appropriate timeouts, attention was first turned to the right femoral vein and a 6 French sheath was placed percutaneously into the right femoral vein with no difficulty.  Next the [facility] workstation was inserted by way of a long 035 guidewire into the right femoral vein and advanced under fluoroscopic guidance to the right atrium.  The needles I snare was then utilized to grab the lead and secured.  No traction was placed on the lead at this time.    Attention was then turned to the patient's left-sided ICD system.  The skin had been opened by the chronic infection and the generator was removed with gentle traction.  The lead was freed up from the generator with a wrench.  The fibrous scar tissue was freed up from the lead with electrocautery.  The necrotic tissue was debrided from the pocket.  The lead was freed up from its scar tissue and dissected down to the sewing sleeve which was pulled back.  A [employer] stylette was inserted into the body of the lead and was able to go almost but not quite to the tip.  Attempt to retract the helix were unsuccessful.  At this point, the [facility] [place] short RL extraction sheath along with the [facility] liberator locking stylette, and Cook 1 tie proximal lead tie were all placed on the lead as typically performed.  The [facility] short sheath was advanced over the lead and into the subclavian vein with  pressure and counterpressure while traction was placed on the body of the lead with the locking stylette.  The sheath was advanced down to the junction of the innominate vein and the superior vena cava.  The extraction sheath was removed and the lead was cut.  Attention was then turned back to the needles I snare and the snare was pulled back into the sheath along with the lead and locked in place.  At this point, the bird 77 French sheath was advanced over the inner sheath and needles I snare.  Utilizing traction as well as pressure with the inner sheath and outer sheath respectively, the entire lead was enveloped inside the outer sheath and all were removed from the right femoral vein with gentle traction.  Pressure was placed on the groin and hemostasis obtained.  A figure-of-eight silk suture was used to help aid in hemostasis.  At this point the attention was returned to the infected ICD pocket.  The pocket was irrigated with antibiotic irrigation.  Electrocautery was utilized to free up the necrotic and fibrous scar tissue.  Electrocautery was utilized to help achieve hemostasis.  Additional irrigation was utilized to irrigate the pocket.  The incision was closed with multiple Prolene mattress sutures.  Packing tape was placed in the pocket.  Pressure dressing was placed and the patient was returned to the recovery area in stable condition.  Prior to the termination of the procedure, the transesophageal echo was carried out demonstrating no pericardial effusion and no tricuspid regurgitation.  Complications: There were  no immediate procedure complications  Conclusion: Successful extraction of a 83 year old active-fixation defibrillator lead and generator in a patient whose device had eroded through the skin and was chronically infected.  Lewayne Bunting, MD

## 2018-09-18 NOTE — Anesthesia Postprocedure Evaluation (Signed)
Anesthesia Post Note  Patient: [name]  Procedure(s) Performed: GENERATOR REMOVAL (Left Chest) ICD LEAD REMOVAL (N/A Chest)     Patient location during evaluation: PACU Anesthesia Type: General Level of consciousness: awake and alert, oriented and patient cooperative Pain management: pain level controlled Vital Signs Assessment: post-procedure vital signs reviewed and stable Respiratory status: spontaneous breathing, nonlabored ventilation and respiratory function stable Cardiovascular status: blood pressure returned to baseline and stable Postop Assessment: no apparent nausea or vomiting Anesthetic complications: no    Last Vitals:  Vitals:   09/18/18 1640 09/18/18 1706  BP: (!) 159/84 (!) 172/77  Pulse: 64 66  Resp: 13 14  Temp:  (!) 36.4 C  SpO2: 97% 98%    Last Pain:  Vitals:   09/18/18 1706  TempSrc: Oral  PainSc:                  Coley Littles,E. Hale Chalfin

## 2018-09-18 NOTE — Progress Notes (Signed)
Patient states that he had chicken, rice, and broccoli at 0530.  Dr. Jean Rosenthal states that patient needs to be NPO for 8 hours, start time can be 1330.  Dr. Ladona Ridgel aware, OR desk aware, Leta Jungling from [employer] aware.   Patient and daughter have been updated.

## 2018-09-18 NOTE — Progress Notes (Signed)
Leta Jungling from [employer] made aware time change and stated she will also let Apolinar Junes know of time change.

## 2018-09-18 NOTE — Interval H&P Note (Signed)
History and Physical Interval Note:  09/18/2018 12:01 PM  [name]  has presented today for surgery, with the diagnosis of MECHANICAL COMPLICATION OF OTHER CARDIAC ELECTRONIC DEVICE  The various methods of treatment have been discussed with the patient and family. After consideration of risks, benefits and other options for treatment, the patient has consented to  Procedure(s): GENERATOR REMOVAL (N/A) ICD LEAD REMOVAL (N/A) as a surgical intervention .  The patient's history has been reviewed, patient examined, no change in status, stable for surgery.  I have reviewed the patient's chart and labs.  Questions were answered to the patient's satisfaction.     Lewayne Bunting

## 2018-09-18 NOTE — Progress Notes (Signed)
Pt and pt's wife unable to remember if he had his home coreg. BP 174/85, pulse 58.  Dr. Ladona Ridgel and Dr. Jean Rosenthal made aware, do not give additional dose.

## 2018-09-19 ENCOUNTER — Encounter (HOSPITAL_COMMUNITY): Payer: Self-pay | Admitting: Internal Medicine

## 2018-09-19 ENCOUNTER — Ambulatory Visit (HOSPITAL_COMMUNITY): Payer: Medicare Other

## 2018-09-19 DIAGNOSIS — Z8674 Personal history of sudden cardiac arrest: Secondary | ICD-10-CM | POA: Diagnosis not present

## 2018-09-19 DIAGNOSIS — I10 Essential (primary) hypertension: Secondary | ICD-10-CM | POA: Diagnosis not present

## 2018-09-19 DIAGNOSIS — T827XXA Infection and inflammatory reaction due to other cardiac and vascular devices, implants and grafts, initial encounter: Secondary | ICD-10-CM | POA: Diagnosis not present

## 2018-09-19 DIAGNOSIS — I2510 Atherosclerotic heart disease of native coronary artery without angina pectoris: Secondary | ICD-10-CM | POA: Diagnosis not present

## 2018-09-19 DIAGNOSIS — T827XXS Infection and inflammatory reaction due to other cardiac and vascular devices, implants and grafts, sequela: Secondary | ICD-10-CM

## 2018-09-19 NOTE — Discharge Summary (Addendum)
ELECTROPHYSIOLOGY PROCEDURE DISCHARGE SUMMARY    Patient ID: [name],  MRN: [id number], DOB/AGE: [birth year] 81 y.o.  Admit date: 09/18/2018 Discharge date: 09/19/2018  Primary Care Physician: Pccm, Raymond Gurney, MD  Primary Cardiologist/Electrophysiologist: Dr. Graciela Husbands (remotely)  Primary Discharge Diagnosis:  1. ICD site infection, device erosion   Secondary Discharge Diagnosis:  1. H/o cardiac arrest 2. CAD 3. HTN 4. HLD  No Known Allergies   Procedures This Admission:  1.  09/18/18: ICD system extraction Conclusion: Successful extraction of a 81 year old active-fixation defibrillator lead and generator in a patient whose device had eroded through the skin and was chronically infected. 2.  CXR on 09/19/18 demonstrated no pneumothorax status post device implantation.   Brief HPI: [name] is a 83 y.o. male was living in the [place] for the last 9 years, his daughter flew him back to be evaluated 2/2 his ICD eroding through the skin.  He was evaluated 09/09/18, placed on prophylactic PO antibiotics, and planned for device extraction  Hospital Course:  The patient was admitted and underwent device extraction with details as outlined above. He was monitored on telemetry overnight which demonstrated SR.  Left chest was without hematoma or ecchymosis. Packing was reviewd this morning by Dr. Ladona Ridgel, the patient tolerated well.  Stitch from R groin was removed as well, patient tolerated well, no bleeding.  Clean dry dressing placed at extraction site. CXR was obtained and demonstrated no pneumothorax status post device implantation.  Wound care, was reviewed with the patient and his family at bedside.  The patient feels well, no CP or SOB, minimal site discomfort, reports Tylenol worked well for him here.  He was examined by Dr. Ladona Ridgel and considered stable for discharge to home.  Wound check/suture removal visit is in place, f/u with Dr. Graciela Husbands as well to  discuss re-implant timing in place as well.  The patient's device implant indication was for secondary prevention, Life vest to be fitted prior to discharge today.  Order was faxed to [employer], representative is aware of patient and pending discharge for today.  Physical Exam: Vitals:   09/19/18 0005 09/19/18 0412 09/19/18 0755 09/19/18 1338  BP: 134/75 (!) 160/80 125/71 (!) 146/82  Pulse: 66 69  75  Resp:   18   Temp: 98.5 F (36.9 C) 97.7 F (36.5 C) 98.6 F (37 C) 97.8 F (36.6 C)  TempSrc: Oral Oral Oral Oral  SpO2: 98% 97%  97%  Weight:  52 kg    Height:        GEN- The patient is well appearing, alert and oriented x 3 today.   HEENT: normocephalic, atraumatic; sclera clear, conjunctiva pink; hearing intact; oropharynx clear Lungs- CTA b/l, normal work of breathing.  No wheezes, rales, rhonchi Heart- RRR,, no murmurs, rubs or gallops, PMI not laterally displaced GI- soft, non-tender, non-distended Extremities- no clubbing, cyanosis, or edema, R groin is soft, nontender, 3+ fem pulses, no hematoma MS- no significant deformity or atrophy Skin- warm and dry, no rash or lesion, left chest without hematoma/ecchymosis Psych- euthymic mood, full affect Neuro- no gross defecits  Labs:   Lab Results  Component Value Date   WBC 8.5 09/09/2018   HGB 11.6 (L) 09/09/2018   HCT 37.2 (L) 09/09/2018   MCV 91.0 09/09/2018   PLT 198 09/09/2018   No results for input(s): NA, K, CL, CO2, BUN, CREATININE, CALCIUM, PROT, BILITOT, ALKPHOS, ALT, AST, GLUCOSE in the last 168 hours.  Invalid input(s): LABALBU  Discharge Medications:  Allergies as of 09/19/2018   No Known Allergies     Medication List    TAKE these medications   carvedilol 25 MG tablet Commonly known as:  COREG Take 0.5 tablets (12.5 mg total) by mouth 2 (two) times daily with a meal.   ECOTRIN LOW STRENGTH 81 MG EC tablet Generic drug:  aspirin Take 81 mg by mouth daily.   nitroGLYCERIN 0.4 MG SL tablet Commonly  known as:  NITROSTAT Place 1 tablet (0.4 mg total) under the tongue as needed.   rosuvastatin 20 MG tablet Commonly known as:  CRESTOR Take 1 tablet (20 mg total) by mouth daily.       Disposition:  Home  Discharge Instructions    Diet - low sodium heart healthy   Complete by:  As directed    Increase activity slowly   Complete by:  As directed      Follow-up Information    [facility] Office Follow up.   Specialty:  Cardiology Why:  09/25/18 @ 10:00AM, wound check visit Contact information: [address] [place] [address] [phone]       Duke Salvia, MD Follow up.   Specialty:  Cardiology Why:  10/14/18 @ 9:45AM Contact information: [address] [place] [address] [phone]           Duration of Discharge Encounter: Greater than 30 minutes including physician time.  SignedFrancis Dowse, PA-C 09/19/2018 2:23 PM   Addendum: The patient did not receive his LifeVest until late last night and opted to stay overnight.  His LifeVest is in place now.  He is doing well without any complaints.  He will be DC to home in stable condition with instructions and follow up as outlined previously. Tereso Newcomer, PA-C    09/20/2018 8:58 AM    EP Attending  Patient seen and examined. Agree with above. DC after Life Vest. followup as above.   Leonia Reeves.D.

## 2018-09-19 NOTE — Progress Notes (Signed)
Received a call from a tec for life vest around 1845, he left [place] and coming here shortly.  Informed pt and family.  The family wanted to leave since it is getting very late for a life vest fitting and discharge. Informed Dot Lanes of the situation.  Hinton Dyer, RN

## 2018-09-19 NOTE — Progress Notes (Signed)
Pt is still waiting for a life vest fitting.  Hinton Dyer, RN

## 2018-09-19 NOTE — Discharge Instructions (Signed)
Wound care instructions Keep incision clean and dry, no showers until cleared to at your wound check visit. No driving until after your wound check visit No salves, ointments or creams to wound You can change the dressing if needed.  Leave steri-strips (little pieces of tape) on until seen in the office for wound check appointment. Call the office [phone]) for redness, drainage, swelling, or fever.

## 2018-09-19 NOTE — Progress Notes (Signed)
   Patient still awaiting placement of lifevest. Distributor is en-route to the hospital but not expected to arrive until after 8pm. Patient/family do not feel comfortable with a late discharge. Will plan to keep overnight and discharge in the morning.   Beatriz Stallion, PA-C 09/19/18

## 2018-09-19 NOTE — Care Management Note (Signed)
Case Management Note  Patient Details  Name: [name] MRN: [id number] Date of Birth: 1935/02/24  Subjective/Objective: Pt presented for ICD extraction. Pt lives in the [place] has support of daughter. Patient has Medicare Part A unsure about Part B. Discussed Psychologist, counselling. Pt in need of Life Vest. Paperwork provided to [employer] and will check to see if patient has Medicare Part B- [name] may have to go through the hospital with Celene Skeen with Social Work to get the [employer].                   Action/Plan: CM will continue to monitor for additional disposition needs.   Expected Discharge Date:  09/19/18               Expected Discharge Plan:  Home/Self Care  In-House Referral:  NA  Discharge planning Services  CM Consult  Post Acute Care Choice:  Durable Medical Equipment Choice offered to:  Patient  DME Arranged:  Life vest DME Agency:  [employer]  HH Arranged:  NA HH Agency:  NA  Status of Service:  Completed, signed off  If discussed at Long Length of Stay Meetings, dates discussed:    Additional Comments:  Gala Lewandowsky, RN 09/19/2018, 12:20 PM

## 2018-09-20 DIAGNOSIS — T827XXA Infection and inflammatory reaction due to other cardiac and vascular devices, implants and grafts, initial encounter: Secondary | ICD-10-CM | POA: Diagnosis not present

## 2018-09-22 LAB — TYPE AND SCREEN
ABO/RH(D): O POS
ANTIBODY SCREEN: NEGATIVE
UNIT DIVISION: 0
UNIT DIVISION: 0

## 2018-09-22 LAB — BPAM RBC
BLOOD PRODUCT EXPIRATION DATE: 201911162359
BLOOD PRODUCT EXPIRATION DATE: 201912032359
Unit Type and Rh: 5100
Unit Type and Rh: 5100

## 2018-09-25 ENCOUNTER — Ambulatory Visit: Payer: Self-pay

## 2018-09-26 ENCOUNTER — Ambulatory Visit (INDEPENDENT_AMBULATORY_CARE_PROVIDER_SITE_OTHER): Payer: Self-pay | Admitting: *Deleted

## 2018-09-26 DIAGNOSIS — T827XXS Infection and inflammatory reaction due to other cardiac and vascular devices, implants and grafts, sequela: Secondary | ICD-10-CM

## 2018-09-26 NOTE — Patient Instructions (Addendum)
Per Dr. Graciela Husbands, you may shower. Wash site gently, replace gauze and tape dressing after patting dry.  Call the Device Clinic at [phone] or seek emergency medical attention if over the weekend for symptoms of infection, including fever/chills.  Wound recheck on Monday, 09/29/18 at 3:00pm.

## 2018-09-26 NOTE — Progress Notes (Signed)
Wound check in clinic. Gauze dressing removed. Incision edges approximated and healing well. Wound without redness or edema. Dr. Graciela Husbands assessed site. Every other suture removed from incision per Dr. Odessa Fleming instruction, incision edges remain approximated. Plan for ROV with Device Clinic on Monday 09/29/18 to remove remaining sutures per Dr. Graciela Husbands. Per Dr. Graciela Husbands, patient may shower and gently wash site. Patient given gauze and tape for daily dressing changes at home. Patient and daughter aware to call the Device Clinic or seek emergency medical attention if he develops any signs/symptoms of infection prior to wound recheck appointment.

## 2018-09-26 NOTE — H&P (Signed)
ICD Criteria  Current LVEF:33%. Within 12 months prior to implant: Yes   Heart failure history: Yes, Class I  Cardiomyopathy history: Yes, Non-Ischemic Cardiomyopathy.  Atrial Fibrillation/Atrial Flutter: No.  Ventricular tachycardia history: Yes, Hemodynamic instability present. VT Type: Sustained Ventricular Tachycardia - Polymorphic.  Cardiac arrest history: Yes, Ventricular Fibrillation.  History of syndromes with risk of sudden death: No.  Previous ICD: Yes, Reason for ICD:  Secondary prevention.  Current ICD indication: Secondary  PPM indication: No.  Class I or II Bradycardia indication present: No  Beta Blocker therapy for 3 or more months: Yes, prescribed.   Ace Inhibitor/ARB therapy for 3 or more months: No, medical reason.

## 2018-09-29 ENCOUNTER — Ambulatory Visit: Payer: Self-pay

## 2018-09-29 ENCOUNTER — Ambulatory Visit (INDEPENDENT_AMBULATORY_CARE_PROVIDER_SITE_OTHER): Payer: Self-pay | Admitting: *Deleted

## 2018-09-29 DIAGNOSIS — T827XXS Infection and inflammatory reaction due to other cardiac and vascular devices, implants and grafts, sequela: Secondary | ICD-10-CM

## 2018-09-29 NOTE — Progress Notes (Signed)
Wound re-check, remaining sutures removed from left incision site. Incision intact, no drainage, no redness. 4x4s and paper tape applied to protect site from life vest strap. Pts daughter educated about wound care and to call device clinic if site starts draining, redness or edema develops. ROV w/ SK 10/14/18 to discuss re-implantation

## 2018-10-14 ENCOUNTER — Encounter: Payer: Self-pay | Admitting: Internal Medicine

## 2018-10-14 ENCOUNTER — Ambulatory Visit (INDEPENDENT_AMBULATORY_CARE_PROVIDER_SITE_OTHER): Payer: Medicare Other | Admitting: Internal Medicine

## 2018-10-14 VITALS — BP 138/78 | HR 69 | Ht 65.0 in | Wt 121.2 lb

## 2018-10-14 DIAGNOSIS — I4901 Ventricular fibrillation: Secondary | ICD-10-CM

## 2018-10-14 DIAGNOSIS — T827XXD Infection and inflammatory reaction due to other cardiac and vascular devices, implants and grafts, subsequent encounter: Secondary | ICD-10-CM | POA: Diagnosis not present

## 2018-10-14 DIAGNOSIS — T827XXS Infection and inflammatory reaction due to other cardiac and vascular devices, implants and grafts, sequela: Secondary | ICD-10-CM | POA: Diagnosis not present

## 2018-10-14 DIAGNOSIS — I255 Ischemic cardiomyopathy: Secondary | ICD-10-CM | POA: Diagnosis not present

## 2018-10-14 NOTE — Progress Notes (Signed)
    Patient Care Team: Pccm, [facility], MD as PCP - General (Internal Medicine)   HPI  [name] is a 83 y.o. male Seen following device explantation for erosion.  Implanted here prior to 2010 for secondary prevention.  Return to the [place] and was without ongoing care.  He returned with an eroded pocket and underwent extraction 11/19 he was fitted with a LifeVest.  Echocardiogram demonstrated EF of 55-60%.  He has a history of coronary artery disease with prior bypass in 2002 and catheterization 2009 demonstrating patent vein grafts and LIMA    The patient denies chest pain, shortness of breath, nocturnal dyspnea, orthopnea or peripheral edema.  There have been no palpitations, lightheadedness or syncope.    Records and Results Reviewed   Past Medical History:  Diagnosis Date  . Anxiety   . Benign prostatic hypertrophy   . Coronary artery disease    LAst LHC in 6/09 showed to LAD, To mRCA, to CFX. SVG=-RCA patent., SVG-D patent, SVG-OM2 2 patent, LIMA-LAD patent. EF 40-45%  . Frequency of urination   . GERD (gastroesophageal reflux disease)   . Hyperlipemia   . Hypertension   . Inguinal hernia   . Personal history of colonic polyps   . Right knee DJD   . Ventricular fibrillation (HCC) 6/09   scar related as no acute ischemia. Patient has Medtronic ICD    Past Surgical History:  Procedure Laterality Date  . CORONARY ANGIOPLASTY    . CORONARY ARTERY BYPASS GRAFT  2001  . FOOT SURGERY Bilateral   . GENERATOR REMOVAL Left 09/18/2018   Procedure: GENERATOR REMOVAL;  Surgeon: Taylor, Gregg W, MD;  Location: [facility] OR;  Service: Cardiovascular;  Laterality: Left;  . ICD LEAD REMOVAL N/A 09/18/2018   Procedure: ICD LEAD REMOVAL;  Surgeon: Taylor, Gregg W, MD;  Location: [facility] OR;  Service: Cardiovascular;  Laterality: N/A;  . inguinal herniorrhaphy, right  1959  . turp (otheR)  7/08   Dr Wren    Current Meds  Medication Sig  . aspirin (ECOTRIN LOW  STRENGTH) 81 MG EC tablet Take 81 mg by mouth daily.    . carvedilol (COREG) 25 MG tablet Take 0.5 tablets (12.5 mg total) by mouth 2 (two) times daily with a meal.  . nitroGLYCERIN (NITROSTAT) 0.4 MG SL tablet Place 1 tablet (0.4 mg total) under the tongue as needed.    No Known Allergies    Review of Systems negative except from HPI and PMH  Physical Exam BP 138/78   Pulse 69   Ht 5' 5" (1.651 m)   Wt 121 lb 3.2 oz (55 kg)   SpO2 97%   BMI 20.17 kg/m  Well developed and well nourished in no acute distress HENT normal E scleral and icterus clear Neck Supple JVP flat; carotids brisk and full Clear to ausculation Healed scar Regular rate and rhythm, no murmurs gallops or rub Soft with active bowel sounds No clubbing cyanosis  Edema Alert and oriented, grossly normal motor and sensory function Skin Warm and Dry  ECG demonstrates sinus rhythm at 92 Intervals 20/10/39 Incomplete right bundle branch block Inferior wall MI Possible anterior wall MI  Assessment and  Plan  Ischemic heart disease with prior bypass surgery  VF arrest-reported  ICD-eroded and explanted  The patient has a secondary prevention indication for an ICD.  We reviewed data from AVID suggesting comparable outcomes using amiodarone in patients with retained LV function.  We also discussed end-of-life issues regarding sudden   death and non-sudden death.  Furthermore we reviewed the initial indication for an ICD.  No MI occurred hence, secondary prevention was the proper diagnosis  He would like to proceed with an ICD  Have reviewed the potential benefits and risks of ICD implantation including but not limited to death, perforation of heart or lung, lead dislodgement, infection,  device malfunction and inappropriate shocks.  The patient and family express understanding  and are willing to proceed.        Current medicines are reviewed at length with the patient today .  The patient does not   have  concerns regarding medicines.  

## 2018-10-14 NOTE — Patient Instructions (Signed)
Medication Instructions:  Your physician recommends that you continue on your current medications as directed. Please refer to the Current Medication list given to you today.  Labwork: You will have labs drawn today: CBC and BMP  Testing/Procedures: Your physician has recommended that you have a defibrillator inserted. An implantable cardioverter defibrillator (ICD) is a small device that is placed in your chest or, in rare cases, your abdomen. This device uses electrical pulses or shocks to help control life-threatening, irregular heartbeats that could lead the heart to suddenly stop beating (sudden cardiac arrest). Leads are attached to the ICD that goes into your heart. This is done in the hospital and usually requires an overnight stay. Please see the instruction sheet given to you today for more information.  Follow-Up: Your physician recommends that you schedule a follow-up appointment in:   10-14 days after 12/9 for a wound check with our device clinic 91 days after 12/9 with Dr Graciela Husbands for a device check  Any Other Special Instructions Will Be Listed Below (If Applicable).     If you need a refill on your cardiac medications before your next appointment, please call your pharmacy.

## 2018-10-14 NOTE — H&P (View-Only) (Signed)
Patient Care Team: Pccm, Raymond Gurney, MD as PCP - General (Internal Medicine)   HPI  [name] is a 83 y.o. male Seen following device explantation for erosion.  Implanted here prior to 2010 for secondary prevention.  [place] and was without ongoing care.  He returned with an eroded pocket and underwent extraction 11/19 he was fitted with a LifeVest.  Echocardiogram demonstrated EF of 55-60%.  He has a history of coronary artery disease with prior bypass in 2002 and catheterization 2009 demonstrating patent vein grafts and LIMA    The patient denies chest pain, shortness of breath, nocturnal dyspnea, orthopnea or peripheral edema.  There have been no palpitations, lightheadedness or syncope.    Records and Results Reviewed   Past Medical History:  Diagnosis Date  . Anxiety   . Benign prostatic hypertrophy   . Coronary artery disease    LAst LHC in 6/09 showed to LAD, To mRCA, to CFX. SVG=-RCA patent., SVG-D patent, SVG-OM2 2 patent, LIMA-LAD patent. EF 40-45%  . Frequency of urination   . GERD (gastroesophageal reflux disease)   . Hyperlipemia   . Hypertension   . Inguinal hernia   . Personal history of colonic polyps   . Right knee DJD   . Ventricular fibrillation (HCC) 6/09   scar related as no acute ischemia. Patient has Medtronic ICD    Past Surgical History:  Procedure Laterality Date  . CORONARY ANGIOPLASTY    . CORONARY ARTERY BYPASS GRAFT  2001  . FOOT SURGERY Bilateral   . GENERATOR REMOVAL Left 09/18/2018   Procedure: GENERATOR REMOVAL;  Surgeon: Marinus Maw, MD;  Location: [facility];  Service: Cardiovascular;  Laterality: Left;  . ICD LEAD REMOVAL N/A 09/18/2018   Procedure: ICD LEAD REMOVAL;  Surgeon: Marinus Maw, MD;  [facility] OR;  Service: Cardiovascular;  Laterality: N/A;  . inguinal herniorrhaphy, right  1959  . turp (otheR)  7/08   Dr Wilson Singer    Current Meds  Medication Sig  . aspirin (ECOTRIN LOW  STRENGTH) 81 MG EC tablet Take 81 mg by mouth daily.    . carvedilol (COREG) 25 MG tablet Take 0.5 tablets (12.5 mg total) by mouth 2 (two) times daily with a meal.  . nitroGLYCERIN (NITROSTAT) 0.4 MG SL tablet Place 1 tablet (0.4 mg total) under the tongue as needed.    No Known Allergies    Review of Systems negative except from HPI and PMH  Physical Exam BP 138/78   Pulse 69   Ht 5\' 5"  (1.651 m)   Wt 121 lb 3.2 oz (55 kg)   SpO2 97%   BMI 20.17 kg/m  Well developed and well nourished in no acute distress HENT normal E scleral and icterus clear Neck Supple JVP flat; carotids brisk and full Clear to ausculation Healed scar Regular rate and rhythm, no murmurs gallops or rub Soft with active bowel sounds No clubbing cyanosis  Edema Alert and oriented, grossly normal motor and sensory function Skin Warm and Dry  ECG demonstrates sinus rhythm at 92 Intervals 20/10/39 Incomplete right bundle branch block Inferior wall MI Possible anterior wall MI  Assessment and  Plan  Ischemic heart disease with prior bypass surgery  VF arrest-reported  ICD-eroded and explanted  The patient has a secondary prevention indication for an ICD.  We reviewed data from AVID suggesting comparable outcomes using amiodarone in patients with retained LV function.  We also discussed end-of-life issues regarding sudden  death and non-sudden death.  Furthermore we reviewed the initial indication for an ICD.  No MI occurred hence, secondary prevention was the proper diagnosis  He would like to proceed with an ICD  Have reviewed the potential benefits and risks of ICD implantation including but not limited to death, perforation of heart or lung, lead dislodgement, infection,  device malfunction and inappropriate shocks.  The patient and family express understanding  and are willing to proceed.        Current medicines are reviewed at length with the patient today .  The patient does not   have  concerns regarding medicines.

## 2018-10-15 LAB — CBC
HEMATOCRIT: 38.5 % (ref 37.5–51.0)
HEMOGLOBIN: 12.8 g/dL — AB (ref 13.0–17.7)
MCH: 29.8 pg (ref 26.6–33.0)
MCHC: 33.2 g/dL (ref 31.5–35.7)
MCV: 90 fL (ref 79–97)
Platelets: 212 10*3/uL (ref 150–450)
RBC: 4.29 x10E6/uL (ref 4.14–5.80)
RDW: 13.4 % (ref 12.3–15.4)
WBC: 9.4 10*3/uL (ref 3.4–10.8)

## 2018-10-15 LAB — BASIC METABOLIC PANEL
BUN/Creatinine Ratio: 16 (ref 10–24)
BUN: 22 mg/dL (ref 8–27)
CALCIUM: 9.6 mg/dL (ref 8.6–10.2)
CHLORIDE: 105 mmol/L (ref 96–106)
CO2: 21 mmol/L (ref 20–29)
Creatinine, Ser: 1.38 mg/dL — ABNORMAL HIGH (ref 0.76–1.27)
GFR calc non Af Amer: 48 mL/min/{1.73_m2} — ABNORMAL LOW (ref >59)
GFR, EST AFRICAN AMERICAN: 55 mL/min/{1.73_m2} — AB (ref >59)
Glucose: 90 mg/dL (ref 65–99)
Potassium: 4.7 mmol/L (ref 3.5–5.2)
Sodium: 142 mmol/L (ref 134–144)

## 2018-10-20 ENCOUNTER — Ambulatory Visit (HOSPITAL_COMMUNITY)
Admission: RE | Admit: 2018-10-20 | Discharge: 2018-10-21 | Disposition: A | Discharge status: Home / Self Care | Payer: Medicare Other | Source: Ambulatory Visit | Attending: Internal Medicine | Admitting: Internal Medicine

## 2018-10-20 ENCOUNTER — Other Ambulatory Visit: Payer: Self-pay

## 2018-10-20 ENCOUNTER — Encounter (HOSPITAL_COMMUNITY)
Admission: RE | Disposition: A | Discharge status: Home / Self Care | Payer: Self-pay | Source: Ambulatory Visit | Attending: Internal Medicine

## 2018-10-20 ENCOUNTER — Encounter (HOSPITAL_COMMUNITY): Payer: Self-pay

## 2018-10-20 DIAGNOSIS — Z7982 Long term (current) use of aspirin: Secondary | ICD-10-CM | POA: Diagnosis not present

## 2018-10-20 DIAGNOSIS — Z8674 Personal history of sudden cardiac arrest: Secondary | ICD-10-CM | POA: Insufficient documentation

## 2018-10-20 DIAGNOSIS — Z951 Presence of aortocoronary bypass graft: Secondary | ICD-10-CM | POA: Diagnosis not present

## 2018-10-20 DIAGNOSIS — K219 Gastro-esophageal reflux disease without esophagitis: Secondary | ICD-10-CM | POA: Diagnosis not present

## 2018-10-20 DIAGNOSIS — E785 Hyperlipidemia, unspecified: Secondary | ICD-10-CM | POA: Insufficient documentation

## 2018-10-20 DIAGNOSIS — I4901 Ventricular fibrillation: Secondary | ICD-10-CM

## 2018-10-20 DIAGNOSIS — I4510 Unspecified right bundle-branch block: Secondary | ICD-10-CM | POA: Insufficient documentation

## 2018-10-20 DIAGNOSIS — Z959 Presence of cardiac and vascular implant and graft, unspecified: Secondary | ICD-10-CM

## 2018-10-20 DIAGNOSIS — I2510 Atherosclerotic heart disease of native coronary artery without angina pectoris: Secondary | ICD-10-CM | POA: Diagnosis not present

## 2018-10-20 DIAGNOSIS — I255 Ischemic cardiomyopathy: Secondary | ICD-10-CM

## 2018-10-20 DIAGNOSIS — Z79899 Other long term (current) drug therapy: Secondary | ICD-10-CM | POA: Insufficient documentation

## 2018-10-20 DIAGNOSIS — I472 Ventricular tachycardia, unspecified: Secondary | ICD-10-CM

## 2018-10-20 DIAGNOSIS — N401 Enlarged prostate with lower urinary tract symptoms: Secondary | ICD-10-CM | POA: Insufficient documentation

## 2018-10-20 DIAGNOSIS — Z955 Presence of coronary angioplasty implant and graft: Secondary | ICD-10-CM | POA: Diagnosis not present

## 2018-10-20 DIAGNOSIS — J9811 Atelectasis: Secondary | ICD-10-CM | POA: Diagnosis not present

## 2018-10-20 DIAGNOSIS — I10 Essential (primary) hypertension: Secondary | ICD-10-CM | POA: Insufficient documentation

## 2018-10-20 DIAGNOSIS — Z006 Encounter for examination for normal comparison and control in clinical research program: Secondary | ICD-10-CM | POA: Diagnosis not present

## 2018-10-20 HISTORY — PX: ICD IMPLANT: EP1208

## 2018-10-20 LAB — SURGICAL PCR SCREEN
MRSA, PCR: NEGATIVE
Staphylococcus aureus: NEGATIVE

## 2018-10-20 SURGERY — ICD IMPLANT

## 2018-10-20 MED ORDER — SODIUM CHLORIDE 0.9 % IV SOLN
INTRAVENOUS | Status: AC
  Filled 2018-10-20: qty 2

## 2018-10-20 MED ORDER — CARVEDILOL 12.5 MG PO TABS
12.5000 mg | ORAL_TABLET | Freq: Two times a day (BID) | ORAL | Status: DC
  Administered 2018-10-20 – 2018-10-21 (×2): 12.5 mg via ORAL
  Filled 2018-10-20 (×2): qty 1

## 2018-10-20 MED ORDER — CEFAZOLIN SODIUM-DEXTROSE 1-4 GM/50ML-% IV SOLN
1.0000 g | Freq: Four times a day (QID) | INTRAVENOUS | Status: AC
  Administered 2018-10-20 – 2018-10-21 (×3): 1 g via INTRAVENOUS
  Filled 2018-10-20 (×3): qty 50

## 2018-10-20 MED ORDER — LIDOCAINE HCL 1 % IJ SOLN
INTRAMUSCULAR | Status: AC
  Filled 2018-10-20: qty 60

## 2018-10-20 MED ORDER — MIDAZOLAM HCL 5 MG/5ML IJ SOLN
INTRAMUSCULAR | Status: AC
  Filled 2018-10-20: qty 5

## 2018-10-20 MED ORDER — CEFAZOLIN SODIUM-DEXTROSE 2-4 GM/100ML-% IV SOLN
INTRAVENOUS | Status: AC
  Filled 2018-10-20: qty 100

## 2018-10-20 MED ORDER — SODIUM CHLORIDE 0.9 % IV SOLN
INTRAVENOUS | Status: AC
  Administered 2018-10-20: 11:00:00 via INTRAVENOUS

## 2018-10-20 MED ORDER — SODIUM CHLORIDE 0.9 % IV SOLN
INTRAVENOUS | Status: DC
  Administered 2018-10-20: 06:00:00 via INTRAVENOUS

## 2018-10-20 MED ORDER — FENTANYL CITRATE (PF) 100 MCG/2ML IJ SOLN
INTRAMUSCULAR | Status: AC
  Filled 2018-10-20: qty 2

## 2018-10-20 MED ORDER — ONDANSETRON HCL 4 MG/2ML IJ SOLN: 4.0000 mg | Freq: Four times a day (QID) | INTRAMUSCULAR | Status: DC | PRN

## 2018-10-20 MED ORDER — HEPARIN (PORCINE) IN NACL 1000-0.9 UT/500ML-% IV SOLN
INTRAVENOUS | Status: AC
  Filled 2018-10-20: qty 500

## 2018-10-20 MED ORDER — CHLORHEXIDINE GLUCONATE 4 % EX LIQD
60.0000 mL | Freq: Once | CUTANEOUS | Status: DC
  Filled 2018-10-20: qty 60

## 2018-10-20 MED ORDER — MUPIROCIN 2 % EX OINT
1.0000 "application " | TOPICAL_OINTMENT | Freq: Once | CUTANEOUS | Status: AC
  Administered 2018-10-20: 1 via TOPICAL
  Filled 2018-10-20: qty 22

## 2018-10-20 MED ORDER — CEFAZOLIN SODIUM-DEXTROSE 2-4 GM/100ML-% IV SOLN
2.0000 g | INTRAVENOUS | Status: AC
  Administered 2018-10-20: 2 g via INTRAVENOUS

## 2018-10-20 MED ORDER — HEPARIN (PORCINE) IN NACL 1000-0.9 UT/500ML-% IV SOLN
INTRAVENOUS | Status: DC | PRN
  Administered 2018-10-20: 500 mL

## 2018-10-20 MED ORDER — SODIUM CHLORIDE 0.9 % IV SOLN
80.0000 mg | INTRAVENOUS | Status: AC
  Administered 2018-10-20: 80 mg
  Filled 2018-10-20: qty 2

## 2018-10-20 MED ORDER — FENTANYL CITRATE (PF) 100 MCG/2ML IJ SOLN
INTRAMUSCULAR | Status: DC | PRN
  Administered 2018-10-20 (×2): 12.5 ug via INTRAVENOUS
  Administered 2018-10-20: 25 ug via INTRAVENOUS

## 2018-10-20 MED ORDER — ACETAMINOPHEN 325 MG PO TABS
325.0000 mg | ORAL_TABLET | ORAL | Status: DC | PRN
  Filled 2018-10-20: qty 2

## 2018-10-20 MED ORDER — LIDOCAINE HCL (PF) 1 % IJ SOLN
INTRAMUSCULAR | Status: DC | PRN
  Administered 2018-10-20: 60 mL

## 2018-10-20 MED ORDER — MIDAZOLAM HCL 5 MG/5ML IJ SOLN
INTRAMUSCULAR | Status: DC | PRN
  Administered 2018-10-20 (×2): 1 mg via INTRAVENOUS

## 2018-10-20 MED ORDER — IOPAMIDOL (ISOVUE-370) INJECTION 76%
INTRAVENOUS | Status: AC
  Filled 2018-10-20: qty 50

## 2018-10-20 MED ORDER — IOPAMIDOL (ISOVUE-370) INJECTION 76%
INTRAVENOUS | Status: DC | PRN
  Administered 2018-10-20: 10 mL via INTRAVENOUS

## 2018-10-20 MED ORDER — ASPIRIN EC 81 MG PO TBEC
81.0000 mg | DELAYED_RELEASE_TABLET | Freq: Every day | ORAL | Status: DC
  Administered 2018-10-20 – 2018-10-21 (×2): 81 mg via ORAL
  Filled 2018-10-20 (×2): qty 1

## 2018-10-20 MED ORDER — MUPIROCIN 2 % EX OINT
TOPICAL_OINTMENT | CUTANEOUS | Status: AC
  Filled 2018-10-20: qty 22

## 2018-10-20 SURGICAL SUPPLY — 10 items
CABLE SURGICAL S-101-97-12 (CABLE) ×3 IMPLANT
HEMOSTAT SURGICEL 2X4 FIBR (HEMOSTASIS) ×3 IMPLANT
ICD VISIA MRI VR DVFB1D4 (ICD Generator) ×1 IMPLANT
KIT MICROPUNCTURE NIT STIFF (SHEATH) ×3 IMPLANT
LEAD SPRINT QUAT SEC 6935M-55 (Lead) ×3 IMPLANT
PAD PRO RADIOLUCENT 2001M-C (PAD) ×3 IMPLANT
SHEATH CLASSIC 7F (SHEATH) IMPLANT
SHEATH CLASSIC 9F (SHEATH) ×3 IMPLANT
TRAY PACEMAKER INSERTION (PACKS) ×3 IMPLANT
VISIA MRI VR DVFB1D4 (ICD Generator) ×3 IMPLANT

## 2018-10-20 NOTE — Progress Notes (Signed)
Released orders, oriented to room, connected pt to frequent vital signs, confirmed telemetry box with CCMD. Pt denies pain. Educated on right arm restrictions. Will continue to monitor

## 2018-10-20 NOTE — Interval H&P Note (Signed)
  ICD Criteria  Current LVEF:50%. Within 12 months prior to implant: Yes   Heart failure history: Yes, Class I  Cardiomyopathy history: Yes, Ischemic Cardiomyopathy - Prior MI.  Atrial Fibrillation/Atrial Flutter: No.  Ventricular tachycardia history: Yes, Hemodynamic instability present. VT Type: ventricular fibrillation  Cardiac arrest history: No.  History of syndromes with risk of sudden death: No.  Previous ICD: Yes, Reason for ICD:  Secondary prevention.  Current ICD indication: Secondary  PPM indication: No.  Class I or II Bradycardia indication present: No  Beta Blocker therapy for 3 or more months: Yes, prescribed.   Ace Inhibitor/ARB therapy for 3 or more months: No, medical reason.   I have seen [name] is a 83 y.o. malepre-procedural and has been referree for consideration of ICD implant for secondary prevention of sudden death.  The patient's chart has been reviewed and they meet criteria for ICD implant.  I have had a thorough discussion with the patient reviewing options.  The patient and their family (if available) have had opportunities to ask questions and have them answered. The patient and I have decided together through the [facility] Heart Care Share Decision Support Tool to reimplant ICD at this time.  Risks, benefits, alternatives to ICD implantation were discussed in detail with the patient today. The patient  understands that the risks include but are not limited to bleeding, infection, pneumothorax, perforation, tamponade, vascular damage, renal failure, MI, stroke, death, inappropriate shocks, and lead dislodgement and   wishes to proceed.  History and Physical Interval Note:  10/20/2018 7:16 AM  [name]  has presented today for surgery, with the diagnosis of cm  The various methods of treatment have been discussed with the patient and family. After consideration of risks, benefits and other options for treatment, the patient has  consented to  Procedure(s): ICD IMPLANT - dual chamber (N/A) as a surgical intervention .  The patient's history has been reviewed, patient examined, no change in status, stable for surgery.  I have reviewed the patient's chart and labs.  Questions were answered to the patient's satisfaction.     [name]

## 2018-10-20 NOTE — Progress Notes (Signed)
Called admitting, no family down stairs  Will continue to monitor  Ordered lunch for pt

## 2018-10-21 ENCOUNTER — Ambulatory Visit (HOSPITAL_COMMUNITY): Payer: Medicare Other

## 2018-10-21 DIAGNOSIS — J9811 Atelectasis: Secondary | ICD-10-CM | POA: Diagnosis not present

## 2018-10-21 DIAGNOSIS — I10 Essential (primary) hypertension: Secondary | ICD-10-CM | POA: Diagnosis not present

## 2018-10-21 DIAGNOSIS — I472 Ventricular tachycardia: Secondary | ICD-10-CM | POA: Diagnosis not present

## 2018-10-21 DIAGNOSIS — Z006 Encounter for examination for normal comparison and control in clinical research program: Secondary | ICD-10-CM | POA: Diagnosis not present

## 2018-10-21 MED FILL — Lidocaine HCl Local Inj 1%: INTRAMUSCULAR | Qty: 60 | Status: AC

## 2018-10-21 NOTE — Discharge Instructions (Signed)
° ° °  Supplemental Discharge Instructions for  Pacemaker/Defibrillator Patients  Activity No heavy lifting or vigorous activity with your left/right arm for 6 to 8 weeks.  Do not raise your left/right arm above your head for one week.  Gradually raise your affected arm as drawn below.              10/24/18                   10/25/18                  10/26/18                  10/27/18 __  NO DRIVING for  1 week   ; you may begin driving on  35/52/17  .  WOUND CARE - Keep the wound area clean and dry.  Do not get this area wet, no showers for 24 hours; you may shower on  10/22/18 evening . - The tape/steri-strips on your wound will fall off; do not pull them off.  No bandage is needed on the site.  DO  NOT apply any creams, oils, or ointments to the wound area. - If you notice any drainage or discharge from the wound, any swelling or bruising at the site, or you develop a fever > 101? F after you are discharged home, call the office at once.  Special Instructions - You are still able to use cellular telephones; use the ear opposite the side where you have your pacemaker/defibrillator.  Avoid carrying your cellular phone near your device. - When traveling through airports, show security personnel your identification card to avoid being screened in the metal detectors.  Ask the security personnel to use the hand wand. - Avoid arc welding equipment, MRI testing (magnetic resonance imaging), TENS units (transcutaneous nerve stimulators).  Call the office for questions about other devices. - Avoid electrical appliances that are in poor condition or are not properly grounded. - Microwave ovens are safe to be near or to operate.  Additional information for defibrillator patients should your device go off: - If your device goes off ONCE and you feel fine afterward, notify the device clinic nurses. - If your device goes off ONCE and you do not feel well afterward, call 911. - If your device goes off  TWICE, call 911. - If your device goes off THREE times in one day, call 911.  DO NOT DRIVE YOURSELF OR A FAMILY MEMBER WITH A DEFIBRILLATOR TO THE HOSPITAL--CALL 911.

## 2018-10-21 NOTE — Plan of Care (Signed)

## 2018-10-21 NOTE — Progress Notes (Signed)
patient is a light sleeper, denies pain and wants light to stay on throughout the night.

## 2018-10-21 NOTE — Progress Notes (Signed)
Reviewed AVS discharge instructions with patient/caregiver. Patient/caregiver verbalizes understanding of instructions received. AVS and prescriptions received by patient/caregiver. If present, telemetry box removed and central cardiac monitoring department notified of discharge. Peripheral IV removed, site benign with tip intact.  

## 2018-10-21 NOTE — Discharge Summary (Signed)
ELECTROPHYSIOLOGY PROCEDURE DISCHARGE SUMMARY    Patient ID: [name],  MRN: [id number], DOB/AGE: Mar 23, 1935 83 y.o.  Admit date: 10/20/2018 Discharge date: 10/21/2018  Primary Care Physician: Pccm, Raymond Gurney, MD  Electrophysiologist: Dr. Graciela Husbands  Primary Discharge Diagnosis:  1. H/o cardiac arrest 2. ICD extracted recently 2/2 erosion, now with new implant this admission  Secondary Discharge Diagnosis:  1. CAD 2. HTN  No Known Allergies   Procedures This Admission:  1.  Implantation of a MDT single chamber ICD on 10/20/18 by DrKlein.  The patient received Medtronic MRI compatible ICD, serial number [id number], Medtronic MRI compatible single coil active fixation defibrillator lead, model 6935 serial number [id number]  DFT's were deferred at time of implant There were no immediate post procedure complications. 2.  CXR on 10/21/18 demonstrated no pneumothorax status post device implantation.   Brief HPI: [name] is a 83 y.o. male who had been living in [place] recently secondary to his IVD that had eroded through his skin.  He underwent device extraction, and returned now for re-implantation.  His first device a secondary prevention device.  Risks, benefits, and alternatives to ICD implantation were reviewed with the patient who wished to proceed.   Hospital Course:  The patient was admitted and underwent implantation of a an with details as outlined above. He was monitored on telemetry overnight which demonstrated SR.  Right chest was without hematoma or ecchymosis.  The device was interrogated and found to be functioning normally.  CXR was obtained and demonstrated no pneumothorax status post device implantation.  Using [employer] interpretor of Hyacinth Meeker # [id number], wound care, arm mobility, and restrictions were reviewed with the patient and his daughter at bedside.  The patient feels well today, no CP or SOB, he was examined  by Dr. Graciela Husbands and considered stable for discharge to home.     Physical Exam: Vitals:   10/20/18 2031 10/21/18 0044 10/21/18 0358 10/21/18 0839  BP: 140/76 134/73 (!) 154/79 98/65  Pulse: 62 64 60 (!) 59  Resp: 20  16 18   Temp: 98.1 F (36.7 C) 97.8 F (36.6 C) 97.7 F (36.5 C) (!) 97.3 F (36.3 C)  TempSrc: Oral Oral Oral Oral  SpO2: 99% 97% 98% 99%  Weight:   51 kg   Height:        GEN- The patient is well appearing, alert and oriented x 3 today.   HEENT: normocephalic, atraumatic; sclera clear, conjunctiva pink; hearing intact; oropharynx clear Lungs- CTA b/l, normal work of breathing.  No wheezes, rales, rhonchi Heart- RRR, no murmurs, rubs or gallops, PMI not laterally displaced GI- soft, non-tender, non-distended Extremities- no clubbing, cyanosis, or edema MS- no significant deformity or atrophy Skin- warm and dry, no rash or lesion, right chest without hematoma/ecchymosis, left chest (extraction site) is well healed. Psych- euthymic mood, full affect Neuro- no gross defecits  Labs:   Lab Results  Component Value Date   WBC 9.4 10/14/2018   HGB 12.8 (L) 10/14/2018   HCT 38.5 10/14/2018   MCV 90 10/14/2018   PLT 212 10/14/2018    Recent Labs  Lab 10/14/18 1635  NA 142  K 4.7  CL 105  CO2 21  BUN 22  CREATININE 1.38*  CALCIUM 9.6  GLUCOSE 90    Discharge Medications:  Allergies as of 10/21/2018   No Known Allergies     Medication List    TAKE these medications   carvedilol 25 MG  tablet Commonly known as:  COREG Take 0.5 tablets (12.5 mg total) by mouth 2 (two) times daily with a meal.   ECOTRIN LOW STRENGTH 81 MG EC tablet Generic drug:  aspirin Take 81 mg by mouth daily after breakfast.   nitroGLYCERIN 0.4 MG SL tablet Commonly known as:  NITROSTAT Place 1 tablet (0.4 mg total) under the tongue as needed.   rosuvastatin 20 MG tablet Commonly known as:  CRESTOR Take 1 tablet (20 mg total) by mouth daily.       Disposition:  Home Discharge Instructions    Diet - low sodium heart healthy   Complete by:  As directed    Increase activity slowly   Complete by:  As directed      Follow-up Information    [facility] Office Follow up.   Specialty:  Cardiology Why:  11/03/18 @ 2:00PM, wound check visit Contact information: [address], [address] [place] [address] [phone]       Duke Salvia, MD Follow up.   Specialty:  Cardiology Why:  01/20/19 @ 2:15PM Contact information: [address] [place] [address] [phone]           Duration of Discharge Encounter: Greater than 30 minutes including physician time.  Norma Fredrickson, PA-C 10/21/2018 10:47 AM

## 2018-11-03 ENCOUNTER — Ambulatory Visit (INDEPENDENT_AMBULATORY_CARE_PROVIDER_SITE_OTHER): Payer: Medicare Other | Admitting: *Deleted

## 2018-11-03 DIAGNOSIS — I255 Ischemic cardiomyopathy: Secondary | ICD-10-CM

## 2018-11-03 DIAGNOSIS — I4901 Ventricular fibrillation: Secondary | ICD-10-CM

## 2018-11-03 LAB — CUP PACEART INCLINIC DEVICE CHECK
Battery Voltage: 3.16 V
Brady Statistic RV Percent Paced: 0.02 %
Date Time Interrogation Session: 20191223162151
HighPow Impedance: 59 Ohm
Implantable Lead Implant Date: 20191209
Implantable Lead Location: 753860
Implantable Pulse Generator Implant Date: 20191209
Lead Channel Impedance Value: 380 Ohm
Lead Channel Impedance Value: 399 Ohm
Lead Channel Pacing Threshold Amplitude: 0.5 V
Lead Channel Pacing Threshold Pulse Width: 0.4 ms
Lead Channel Sensing Intrinsic Amplitude: 9 mV
Lead Channel Sensing Intrinsic Amplitude: 9.625 mV
Lead Channel Setting Pacing Amplitude: 3.5 V
Lead Channel Setting Pacing Pulse Width: 0.4 ms
Lead Channel Setting Sensing Sensitivity: 0.3 mV
MDC IDC MSMT BATTERY REMAINING LONGEVITY: 138 mo

## 2018-11-03 NOTE — Progress Notes (Addendum)
Wound check appointment. Dermabond removed. Wound without redness or edema. Incision edges approximated, wound well healed. Normal device function. Threshold, sensing, and impedances consistent with implant measurements. Device programmed at 3.5V for extra safety margin until 3 month visit. Histogram distribution appropriate for patient and level of activity. (1) "AF" episode x - appears false per EGM. No ventricular arrhythmias noted. Patient educated about wound care, arm mobility, lifting restrictions, shock plan. ROV in 3 months with SK.

## 2018-12-31 ENCOUNTER — Encounter: Payer: Self-pay | Admitting: Internal Medicine

## 2019-01-20 ENCOUNTER — Ambulatory Visit
Admission: RE | Admit: 2019-01-20 | Discharge: 2019-01-20 | Disposition: A | Discharge status: Home / Self Care | Payer: Medicare Other | Source: Ambulatory Visit | Attending: Internal Medicine | Admitting: Internal Medicine

## 2019-01-20 ENCOUNTER — Ambulatory Visit (INDEPENDENT_AMBULATORY_CARE_PROVIDER_SITE_OTHER): Payer: Medicare Other | Admitting: Internal Medicine

## 2019-01-20 ENCOUNTER — Encounter: Payer: Self-pay | Admitting: Internal Medicine

## 2019-01-20 VITALS — BP 136/72 | HR 60 | Ht 65.0 in | Wt 125.2 lb

## 2019-01-20 DIAGNOSIS — R0989 Other specified symptoms and signs involving the circulatory and respiratory systems: Secondary | ICD-10-CM

## 2019-01-20 DIAGNOSIS — I4901 Ventricular fibrillation: Secondary | ICD-10-CM

## 2019-01-20 DIAGNOSIS — T827XXS Infection and inflammatory reaction due to other cardiac and vascular devices, implants and grafts, sequela: Secondary | ICD-10-CM | POA: Diagnosis not present

## 2019-01-20 DIAGNOSIS — I255 Ischemic cardiomyopathy: Secondary | ICD-10-CM

## 2019-01-20 LAB — CUP PACEART INCLINIC DEVICE CHECK
Battery Remaining Longevity: 137 mo
Battery Voltage: 3.13 V
Brady Statistic RV Percent Paced: 0.02 %
Date Time Interrogation Session: 20200310142450
HighPow Impedance: 66 Ohm
Implantable Lead Location: 753860
Implantable Pulse Generator Implant Date: 20191209
Lead Channel Impedance Value: 342 Ohm
Lead Channel Impedance Value: 456 Ohm
Lead Channel Pacing Threshold Amplitude: 0.625 V
Lead Channel Sensing Intrinsic Amplitude: 11 mV
Lead Channel Sensing Intrinsic Amplitude: 12.25 mV
Lead Channel Setting Pacing Amplitude: 2.5 V
Lead Channel Setting Pacing Pulse Width: 0.4 ms
Lead Channel Setting Sensing Sensitivity: 0.3 mV
MDC IDC LEAD IMPLANT DT: 20191209
MDC IDC MSMT LEADCHNL RV PACING THRESHOLD PULSEWIDTH: 0.4 ms

## 2019-01-20 MED ORDER — CARVEDILOL 25 MG PO TABS: 12.5000 mg | ORAL_TABLET | Freq: Two times a day (BID) | ORAL | 3 refills | Status: AC

## 2019-01-20 MED ORDER — ROSUVASTATIN CALCIUM 20 MG PO TABS: 20.0000 mg | ORAL_TABLET | Freq: Every day | ORAL | 3 refills | Status: AC

## 2019-01-20 NOTE — Patient Instructions (Signed)
Medication Instructions:  Your physician recommends that you continue on your current medications as directed. Please refer to the Current Medication list given to you today.  Labwork: None ordered.  Testing/Procedures: A chest x-ray takes a picture of the organs and structures inside the chest, including the heart, lungs, and blood vessels. This test can show several things, including, whether the heart is enlarges; whether fluid is building up in the lungs; and whether pacemaker / defibrillator leads are still in place.   Follow-Up: Your physician recommends that you schedule a follow-up appointment in:   9 months with Dr Graciela Husbands  Any Other Special Instructions Will Be Listed Below (If Applicable).     If you need a refill on your cardiac medications before your next appointment, please call your pharmacy.

## 2019-01-20 NOTE — Progress Notes (Addendum)
Patient Care Team: Norval Gable, DO as PCP - General (Family Medicine)   HPI  [name] is a 84 y.o. male Seen following device explantation for erosion.  Implanted here prior to 2010 for secondary prevention.  Return to the [place] and was without ongoing care.  He returned with an eroded pocket and underwent extraction 11/19 he was fitted with a LifeVest.  Echocardiogram demonstrated EF of 55-60%.  He has a history of coronary artery disease with prior bypass in 2002 and catheterization 2009 demonstrating patent vein grafts and LIMA    The patient denies chest pain, shortness of breath, nocturnal dyspnea, orthopnea or peripheral edema.  There have been no palpitations, lightheadedness or syncope.     Records and Results Reviewed   Past Medical History:  Diagnosis Date  . Anxiety   . Benign prostatic hypertrophy   . Coronary artery disease    LAst LHC in 6/09 showed to LAD, To mRCA, to CFX. SVG=-RCA patent., SVG-D patent, SVG-OM2 2 patent, LIMA-LAD patent. EF 40-45%  . Frequency of urination   . GERD (gastroesophageal reflux disease)   . Hyperlipemia   . Hypertension   . Inguinal hernia   . Personal history of colonic polyps   . Right knee DJD   . Ventricular fibrillation (HCC) 6/09   scar related as no acute ischemia. Patient has Medtronic ICD    Past Surgical History:  Procedure Laterality Date  . CORONARY ANGIOPLASTY    . CORONARY ARTERY BYPASS GRAFT  2001  . FOOT SURGERY Bilateral   . GENERATOR REMOVAL Left 09/18/2018   Procedure: GENERATOR REMOVAL;  Surgeon: Marinus Maw, MD;  Location: [facility] OR;  Service: Cardiovascular;  Laterality: Left;  . ICD IMPLANT N/A 10/20/2018   Procedure: ICD IMPLANT - dual chamber;  Surgeon: Duke Salvia, MD;  Location: [facility] INVASIVE CV LAB;  Service: Cardiovascular;  Laterality: N/A;  . ICD LEAD REMOVAL N/A 09/18/2018   Procedure: ICD LEAD REMOVAL;  Surgeon: Marinus Maw, MD;  Location: [facility] OR;   Service: Cardiovascular;  Laterality: N/A;  . inguinal herniorrhaphy, right  6712  . turp (otheR)  7/08   Dr Wilson Singer    Current Meds  Medication Sig  . aspirin (ECOTRIN LOW STRENGTH) 81 MG EC tablet Take 81 mg by mouth daily after breakfast.   . nitroGLYCERIN (NITROSTAT) 0.4 MG SL tablet Place 1 tablet (0.4 mg total) under the tongue as needed.  . [DISCONTINUED] carvedilol (COREG) 25 MG tablet Take 0.5 tablets (12.5 mg total) by mouth 2 (two) times daily with a meal.  . [DISCONTINUED] rosuvastatin (CRESTOR) 20 MG tablet Take 1 tablet (20 mg total) by mouth daily.    No Known Allergies    Review of Systems negative except from HPI and PMH  Physical Exam BP 136/72   Pulse 60   Ht 5\' 5"  (1.651 m)   Wt 125 lb 3.2 oz (56.8 kg)   SpO2 96%   BMI 20.83 kg/m  Well developed and well nourished in no acute distress HENT normal Neck supple with JVP-flat Clear tubular breath sounds bilaterally Device pocket well healed; without hematoma or erythema.  There is no tethering  Regular rate and rhythm, no  gallop No murmur Abd-soft with active BS No Clubbing cyanosis  edema Skin-warm and dry A & Oriented  Grossly normal sensory and motor function  ECG  Sinus @ 60 20/11/42 IRBBB   Assessment and  Plan  Ischemic heart disease with prior bypass  surgery  VF arrest-reported  ICD-eroded and explanted>>reimplanted 12/19 The patient's device was interrogated and the information was fully reviewed.  The device was reprogrammed to maximize longevity  Abnormal lung exam   Stable  Without symptoms of ischemia  Will get CXR 2/2 abnormal lung exam        Current medicines are reviewed at length with the patient today .  The patient does not   have concerns regarding medicines.

## 2019-01-23 ENCOUNTER — Telehealth: Payer: Self-pay | Admitting: Internal Medicine

## 2019-01-23 NOTE — Telephone Encounter (Signed)
New Message          Patient's daughter is calling for her father results., Would like a call back

## 2019-01-23 NOTE — Telephone Encounter (Signed)
Called pt's daughter and notified her that Dr Graciela Husbands has not yet reviewed pt's CXR as of yet, however the radiologist did not note anything abnormal. She understands I will call her back with Dr Odessa Fleming recommendations.

## 2019-01-29 ENCOUNTER — Telehealth: Payer: Self-pay | Admitting: Internal Medicine

## 2019-01-29 NOTE — Telephone Encounter (Signed)
F/U Message           Patient's daughter is calling to see if Dr. Yolanda Manges had a chance to look at X-rays, pls call and advise.

## 2019-01-29 NOTE — Telephone Encounter (Signed)
Per Dr Graciela Husbands, pt CXR was normal; pts daughter was made aware and had no additional needs.

## 2019-04-21 ENCOUNTER — Encounter: Payer: Medicare Other | Admitting: *Deleted

## 2019-04-22 ENCOUNTER — Telehealth: Payer: Self-pay

## 2019-04-22 NOTE — Telephone Encounter (Signed)
Left message for patient to remind of missed remote transmission.  

## 2019-05-05 ENCOUNTER — Ambulatory Visit (INDEPENDENT_AMBULATORY_CARE_PROVIDER_SITE_OTHER): Payer: Medicare Other | Admitting: *Deleted

## 2019-05-05 DIAGNOSIS — I428 Other cardiomyopathies: Secondary | ICD-10-CM

## 2019-05-05 DIAGNOSIS — I429 Cardiomyopathy, unspecified: Secondary | ICD-10-CM

## 2019-05-05 LAB — CUP PACEART REMOTE DEVICE CHECK
Battery Remaining Longevity: 135 mo
Battery Voltage: 3.09 V
Brady Statistic RV Percent Paced: 0.08 %
Date Time Interrogation Session: 20200623083822
HighPow Impedance: 68 Ohm
Implantable Lead Implant Date: 20191209
Implantable Lead Location: 753860
Implantable Pulse Generator Implant Date: 20191209
Lead Channel Impedance Value: 342 Ohm
Lead Channel Impedance Value: 437 Ohm
Lead Channel Pacing Threshold Amplitude: 0.625 V
Lead Channel Pacing Threshold Pulse Width: 0.4 ms
Lead Channel Sensing Intrinsic Amplitude: 10.375 mV
Lead Channel Sensing Intrinsic Amplitude: 10.375 mV
Lead Channel Setting Pacing Amplitude: 2 V
Lead Channel Setting Pacing Pulse Width: 0.4 ms
Lead Channel Setting Sensing Sensitivity: 0.3 mV

## 2019-05-14 NOTE — Progress Notes (Signed)
Remote ICD transmission.   

## 2019-08-05 ENCOUNTER — Ambulatory Visit (INDEPENDENT_AMBULATORY_CARE_PROVIDER_SITE_OTHER): Payer: Medicare Other | Admitting: *Deleted

## 2019-08-05 DIAGNOSIS — I255 Ischemic cardiomyopathy: Secondary | ICD-10-CM

## 2019-08-05 LAB — CUP PACEART REMOTE DEVICE CHECK
Battery Remaining Longevity: 134 mo
Battery Voltage: 3.05 V
Brady Statistic RV Percent Paced: 0.05 %
Date Time Interrogation Session: 20200923062604
HighPow Impedance: 67 Ohm
Implantable Lead Implant Date: 20191209
Implantable Lead Location: 753860
Implantable Pulse Generator Implant Date: 20191209
Lead Channel Impedance Value: 323 Ohm
Lead Channel Impedance Value: 399 Ohm
Lead Channel Pacing Threshold Amplitude: 0.625 V
Lead Channel Pacing Threshold Pulse Width: 0.4 ms
Lead Channel Sensing Intrinsic Amplitude: 9.375 mV
Lead Channel Sensing Intrinsic Amplitude: 9.375 mV
Lead Channel Setting Pacing Amplitude: 2 V
Lead Channel Setting Pacing Pulse Width: 0.4 ms
Lead Channel Setting Sensing Sensitivity: 0.3 mV

## 2019-08-11 ENCOUNTER — Encounter: Payer: Self-pay | Admitting: Cardiology

## 2019-08-11 NOTE — Progress Notes (Signed)
Remote ICD transmission.   

## 2019-09-22 NOTE — Progress Notes (Signed)
Electrophysiology Office Note Date: 09/23/2019  ID:  RUEL [name], DOB January 12, 1935, MRN [id number]  PCP: [name], [name] Primary Cardiologist: No primary care provider on file. Electrophysiologist: Dr. [name]  CC: Routine ICD follow-up  [name] is a 84 y.o. male seen today for Dr. [name].  They present today for routine electrophysiology followup.  Since last being seen in our clinic, the patient reports doing very well. They deny chest pain, palpitations, dyspnea, PND, orthopnea, nausea, vomiting, dizziness, syncope, edema, weight gain, or early satiety.  He has not had ICD shocks.   Device History: Medtronic Single Chamber ICD 04/19/2008, extracted 09/2018 due to erosion and explanted, re-implanted 10/2018 for chronic systolic CHF with h/o VF arrest in the setting of CABG History of appropriate therapy: No History of AAD therapy: No   Past Medical History:  Diagnosis Date  . Anxiety   . Benign prostatic hypertrophy   . Coronary artery disease    LAst LHC in 6/09 showed to LAD, To mRCA, to CFX. SVG=-RCA patent., SVG-D patent, SVG-OM2 2 patent, LIMA-LAD patent. EF 40-45%  . Frequency of urination   . GERD (gastroesophageal reflux disease)   . Hyperlipemia   . Hypertension   . Inguinal hernia   . Personal history of colonic polyps   . Right knee DJD   . Ventricular fibrillation ([place]) 6/09   scar related as no acute ischemia. Patient has Medtronic ICD   Past Surgical History:  Procedure Laterality Date  . CORONARY ANGIOPLASTY    . CORONARY ARTERY BYPASS GRAFT  2001  . FOOT SURGERY Bilateral   . GENERATOR REMOVAL Left 09/18/2018   Procedure: GENERATOR REMOVAL;  Surgeon: [name], [name];  Location: [facility];  Service: Cardiovascular;  Laterality: Left;  . ICD IMPLANT N/A 10/20/2018   Procedure: ICD IMPLANT - dual chamber;  Surgeon: [name], [name];  Location: [place] CV LAB;  Service: Cardiovascular;  Laterality: N/A;  . ICD LEAD REMOVAL N/A  09/18/2018   Procedure: ICD LEAD REMOVAL;  Surgeon: [name], [name];  Location: [facility] OR;  Service: Cardiovascular;  Laterality: N/A;  . inguinal herniorrhaphy, right  9518  . turp (otheR)  7/08   Dr [name]    Current Outpatient Medications  Medication Sig Dispense Refill  . aspirin (ECOTRIN LOW STRENGTH) 81 MG EC tablet Take 81 mg by mouth daily after breakfast.     . carvedilol (COREG) 25 MG tablet Take 0.5 tablets (12.5 mg total) by mouth 2 (two) times daily with a meal. 90 tablet 3  . nitroGLYCERIN (NITROSTAT) 0.4 MG SL tablet Place 1 tablet (0.4 mg total) under the tongue as needed. 100 tablet 3  . rosuvastatin (CRESTOR) 20 MG tablet Take 1 tablet (20 mg total) by mouth daily. 90 tablet 3   No current facility-administered medications for this visit.     Allergies:   Patient has no known allergies.   Social History: Social History   Socioeconomic History  . Marital status: Married    Spouse name: Not on file  . Number of children: Not on file  . Years of education: Not on file  . Highest education level: Not on file  Occupational History  . Not on file  Social Needs  . Financial resource strain: Not on file  . Food insecurity    Worry: Not on file    Inability: Not on file  . Transportation needs    Medical: Not on file    Non-medical:  Not on file  Tobacco Use  . Smoking status: Never Smoker  . Smokeless tobacco: Never Used  Substance and Sexual Activity  . Alcohol use: No  . Drug use: Never  . Sexual activity: Not on file  Lifestyle  . Physical activity    Days per week: Not on file    Minutes per session: Not on file  . Stress: Not on file  Relationships  . Social Musician on phone: Not on file    Gets together: Not on file    Attends religious service: Not on file    Active member of club or organization: Not on file    Attends meetings of clubs or organizations: Not on file    Relationship status: Not on file  . Intimate partner violence     Fear of current or ex partner: Not on file    Emotionally abused: Not on file    Physically abused: Not on file    Forced sexual activity: Not on file  Other Topics Concern  . 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 Limited english/from [place] Lives with daughter.     Family History: No family history on file.  Review of Systems: All other systems reviewed and are otherwise negative except as noted above.  Physical Exam: Vitals:   09/23/19 1255  BP: 114/71  Pulse: 76  Weight: 122 lb (55.3 kg)  Height: 5\' 5"  (1.651 m)     GEN- The patient is well appearing, alert and oriented x 3 today.   HEENT: normocephalic, atraumatic; sclera clear, conjunctiva pink; hearing intact; oropharynx clear; neck supple, no JVP Lymph- no cervical lymphadenopathy Lungs- Clear to ausculation bilaterally, normal work of breathing.  No wheezes, rales, rhonchi Heart- Regular rate and rhythm, no murmurs, rubs or gallops, PMI not laterally displaced GI- soft, non-tender, non-distended, bowel sounds present, no hepatosplenomegaly Extremities- no clubbing, cyanosis, or edema; DP/PT/radial pulses 2+ bilaterally MS- no significant deformity or atrophy Skin- warm and dry, no rash or lesion; ICD pocket well healed Psych- euthymic mood, full affect Neuro- strength and sensation are intact  ICD interrogation- reviewed in detail today,  See PACEART report  EKG:  EKG is not ordered today.  Recent Labs: 10/14/2018: BUN 22; Creatinine, Ser 1.38; Hemoglobin 12.8; Platelets 212; Potassium 4.7; Sodium 142   Wt Readings from Last 3 Encounters:  09/23/19 122 lb (55.3 kg)  01/20/19 125 lb 3.2 oz (56.8 kg)  10/21/18 112 lb 8 oz (51 kg)     Other studies Reviewed: Additional studies/ records that were reviewed today include: TEE 09/2018 LVEF 55-60%,  Most recent labs, previous EP notes.   Assessment and Plan:  1.  Chronic systolic dysfunction s/p Medtronic single  chamber ICD  euvolemic today Stable on an appropriate medical regimen Normal ICD function See Pace Art report No changes today He sees his PCP next month, and have asked that he have his labs faxed to 10/2018 for completeness.   2. CAD with prior bypass Denies ischemic symptoms  3. H/o ICD erosion and explant Device site stable with no signs of inflammation or erosion.   Current medicines are reviewed at length with the patient today.   The patient does not have concerns regarding his medicines.  The following changes were made today:  none  Disposition:   Follow up with Dr. [place] in 1 year.   Graciela Husbands, PA-C  09/23/2019 2:32 PM  [facility]  [address] [place] [address] [phone] (office) [phone] (fax)

## 2019-09-23 ENCOUNTER — Other Ambulatory Visit: Payer: Self-pay

## 2019-09-23 ENCOUNTER — Ambulatory Visit (INDEPENDENT_AMBULATORY_CARE_PROVIDER_SITE_OTHER): Payer: Medicare Other | Admitting: Student

## 2019-09-23 VITALS — BP 114/71 | HR 76 | Ht 65.0 in | Wt 122.0 lb

## 2019-09-23 DIAGNOSIS — I4901 Ventricular fibrillation: Secondary | ICD-10-CM | POA: Diagnosis not present

## 2019-09-23 DIAGNOSIS — I428 Other cardiomyopathies: Secondary | ICD-10-CM

## 2019-09-23 DIAGNOSIS — T827XXD Infection and inflammatory reaction due to other cardiac and vascular devices, implants and grafts, subsequent encounter: Secondary | ICD-10-CM | POA: Diagnosis not present

## 2019-09-23 DIAGNOSIS — I429 Cardiomyopathy, unspecified: Secondary | ICD-10-CM

## 2019-09-23 DIAGNOSIS — I255 Ischemic cardiomyopathy: Secondary | ICD-10-CM | POA: Diagnosis not present

## 2019-09-23 LAB — CUP PACEART INCLINIC DEVICE CHECK
Battery Remaining Longevity: 134 mo
Battery Voltage: 3.04 V
Brady Statistic RV Percent Paced: 0.05 %
Date Time Interrogation Session: 20201111143721
HighPow Impedance: 70 Ohm
Implantable Lead Implant Date: 20191209
Implantable Lead Location: 753860
Implantable Pulse Generator Implant Date: 20191209
Lead Channel Impedance Value: 380 Ohm
Lead Channel Impedance Value: 456 Ohm
Lead Channel Pacing Threshold Amplitude: 0.75 V
Lead Channel Pacing Threshold Pulse Width: 0.4 ms
Lead Channel Sensing Intrinsic Amplitude: 10.125 mV
Lead Channel Sensing Intrinsic Amplitude: 10.875 mV
Lead Channel Setting Pacing Amplitude: 2 V
Lead Channel Setting Pacing Pulse Width: 0.4 ms
Lead Channel Setting Sensing Sensitivity: 0.3 mV

## 2019-11-04 ENCOUNTER — Ambulatory Visit (INDEPENDENT_AMBULATORY_CARE_PROVIDER_SITE_OTHER): Payer: Medicare Other | Admitting: *Deleted

## 2019-11-04 DIAGNOSIS — I428 Other cardiomyopathies: Secondary | ICD-10-CM | POA: Diagnosis not present

## 2019-11-04 DIAGNOSIS — I429 Cardiomyopathy, unspecified: Secondary | ICD-10-CM

## 2019-11-09 LAB — CUP PACEART REMOTE DEVICE CHECK
Battery Remaining Longevity: 133 mo
Battery Voltage: 3.03 V
Brady Statistic RV Percent Paced: 0.05 %
Date Time Interrogation Session: 20201228043824
HighPow Impedance: 72 Ohm
Implantable Lead Implant Date: 20191209
Implantable Lead Location: 753860
Implantable Pulse Generator Implant Date: 20191209
Lead Channel Impedance Value: 323 Ohm
Lead Channel Impedance Value: 437 Ohm
Lead Channel Pacing Threshold Amplitude: 0.75 V
Lead Channel Pacing Threshold Pulse Width: 0.4 ms
Lead Channel Sensing Intrinsic Amplitude: 9.625 mV
Lead Channel Sensing Intrinsic Amplitude: 9.625 mV
Lead Channel Setting Pacing Amplitude: 2 V
Lead Channel Setting Pacing Pulse Width: 0.4 ms
Lead Channel Setting Sensing Sensitivity: 0.3 mV

## 2019-12-13 ENCOUNTER — Ambulatory Visit: Payer: Self-pay

## 2019-12-18 ENCOUNTER — Ambulatory Visit: Payer: Medicare Other | Attending: Internal Medicine

## 2019-12-18 ENCOUNTER — Ambulatory Visit: Payer: Self-pay

## 2019-12-18 DIAGNOSIS — Z23 Encounter for immunization: Secondary | ICD-10-CM | POA: Insufficient documentation

## 2019-12-18 IMAGING — DX DG CHEST 1V PORT
1 series · 1 of 1 positions shown · non-contrast
Comparison: 11/17/2009

CLINICAL DATA: Defibrillator projecting from chest wall

EXAM:
PORTABLE CHEST 1 VIEW

[chest ap]
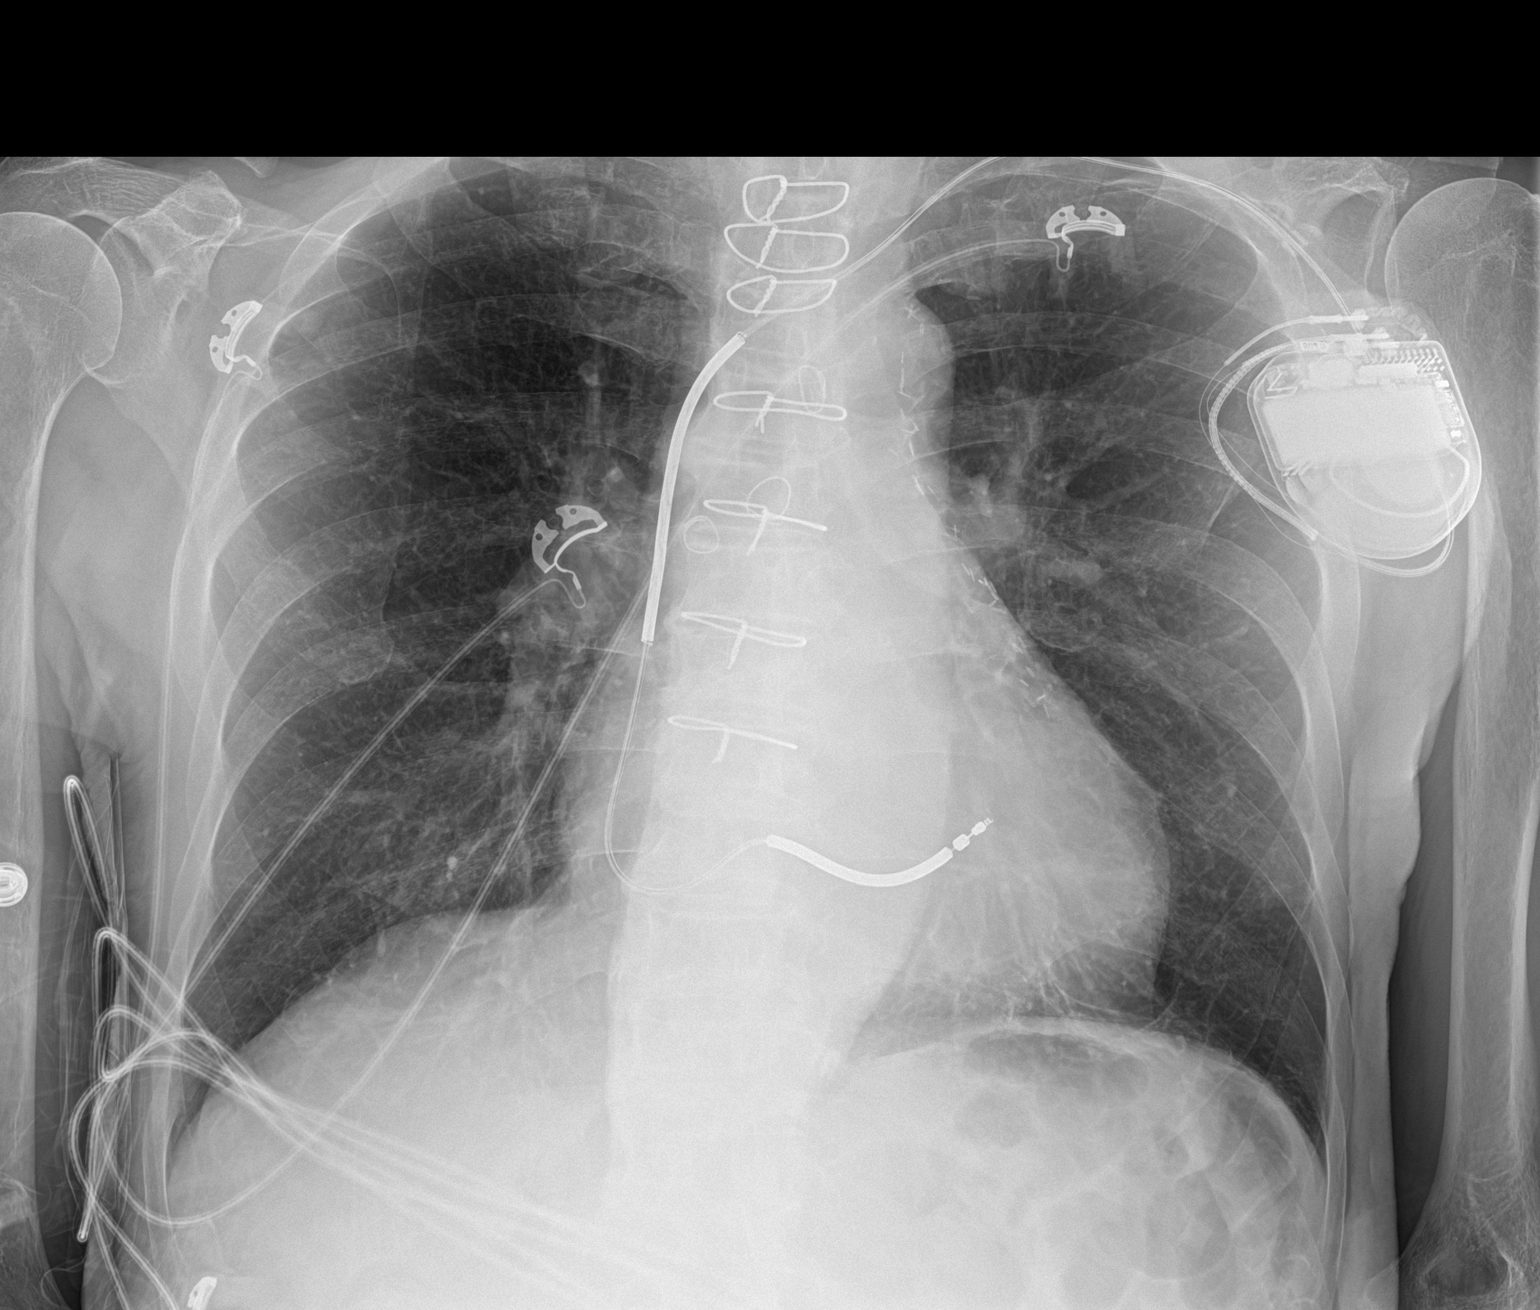

[1 of 1 positions shown; findings below may reference images not displayed]

FINDINGS: Defibrillator unit is again identified and stable. Postsurgical
changes are again seen. Cardiac shadow is at the upper limits of
normal in size. The lungs are well aerated bilaterally. No focal
infiltrate or sizable effusion is seen. No acute bony abnormality is
noted.
IMPRESSION: No change from the prior exam.

## 2019-12-18 NOTE — Progress Notes (Signed)
   Covid-19 Vaccination Clinic  Name:  [name]    MRN: [id number] DOB: 10-15-1935  12/18/2019  [name] was observed post Covid-19 immunization for 15 minutes without incidence. He was provided with Vaccine Information Sheet and instruction to access the V-Safe system.   [name] was instructed to call 911 with any severe reactions post vaccine: Marland Kitchen Difficulty breathing  . Swelling of your face and throat  . A fast heartbeat  . A bad rash all over your body  . Dizziness and weakness    Immunizations Administered    Name Date Dose VIS Date Route   Pfizer COVID-19 Vaccine 12/18/2019  1:46 PM 0.3 mL 10/23/2019 Intramuscular   Manufacturer: [employer], [employer]   Lot: [id number]   NDC: [address]-0

## 2019-12-28 IMAGING — CR DG CHEST 2V
2 series · 2 of 2 positions shown · non-contrast
Comparison: 09/09/2018

CLINICAL DATA: ICD infection.

EXAM:
CHEST - 2 VIEW

[chest pa]
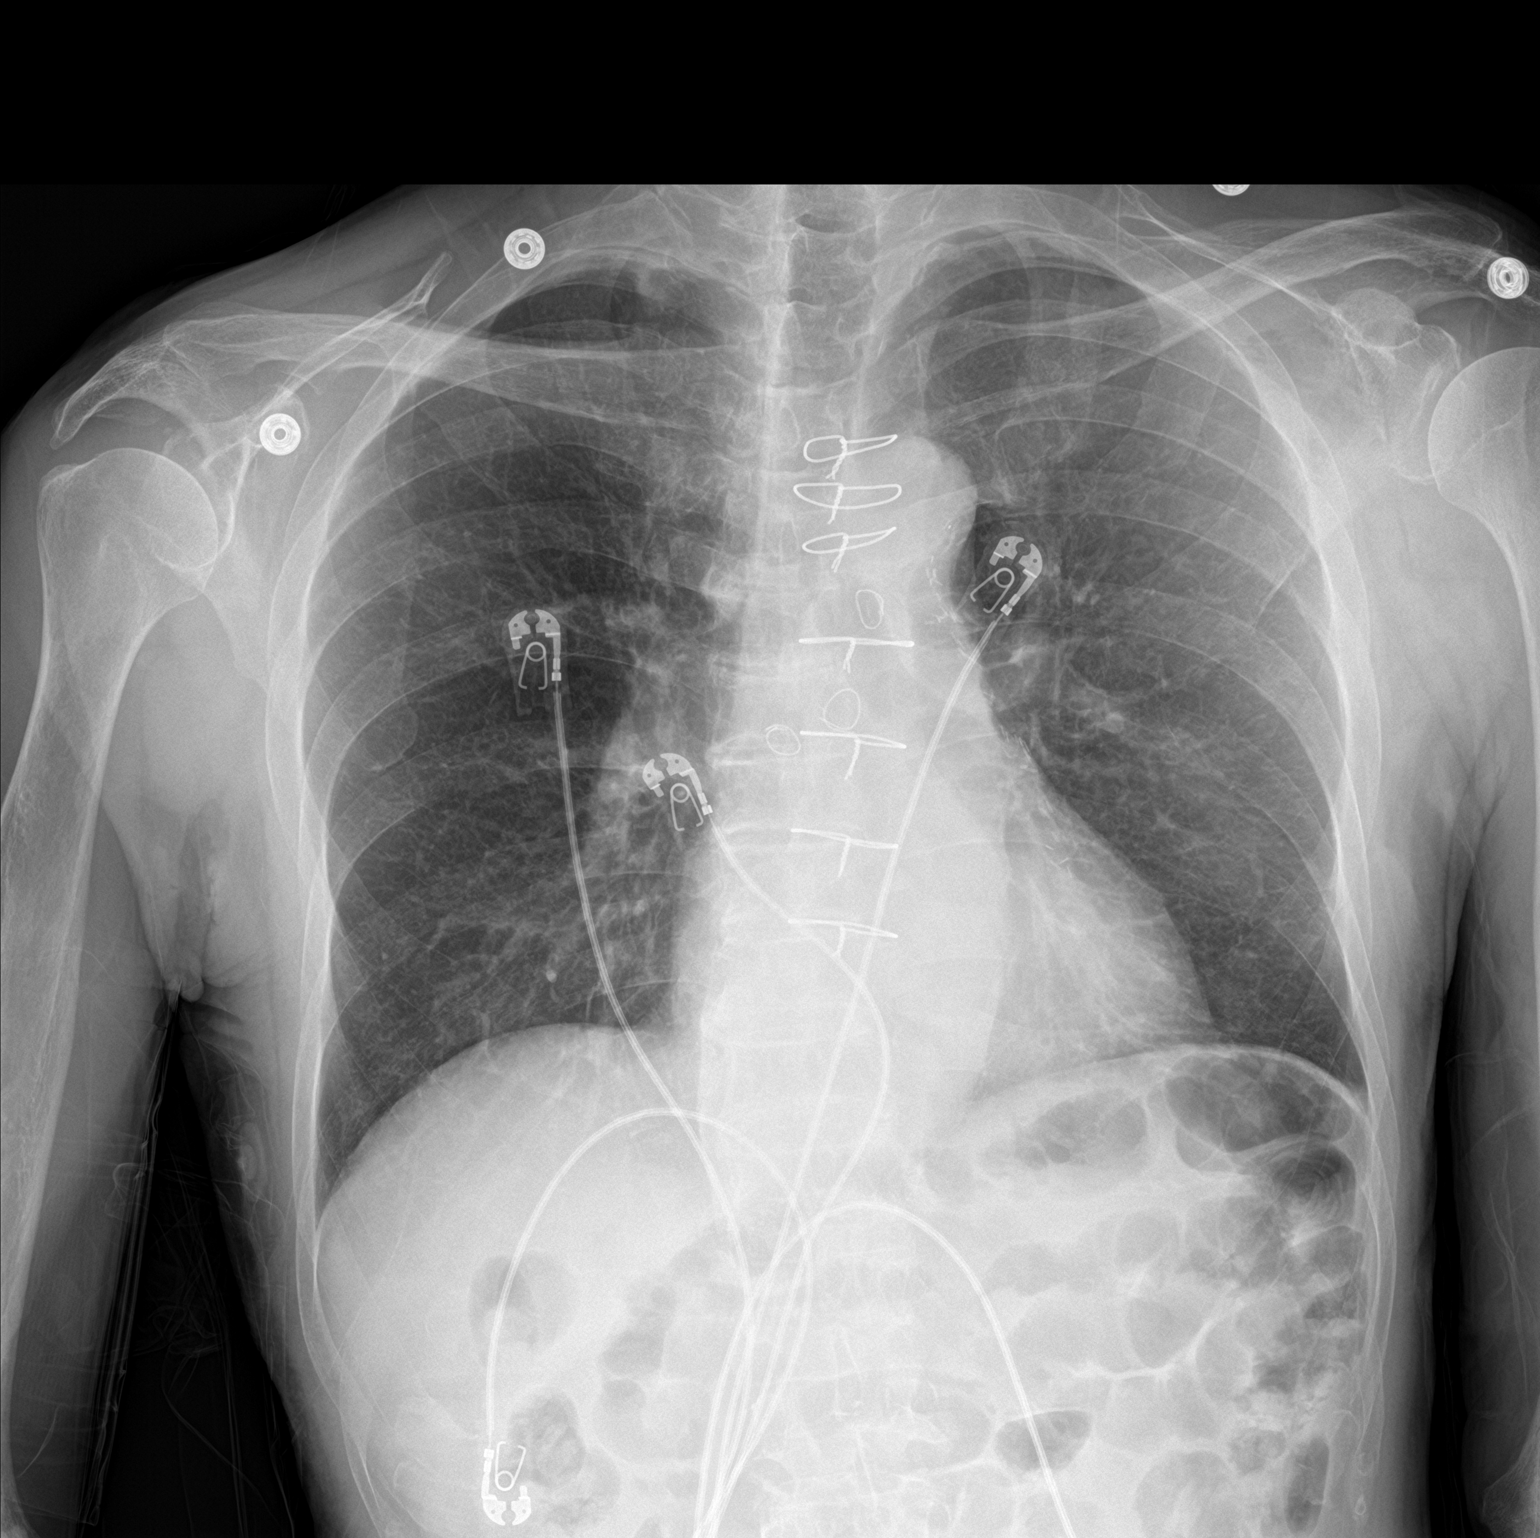

[chest lat]
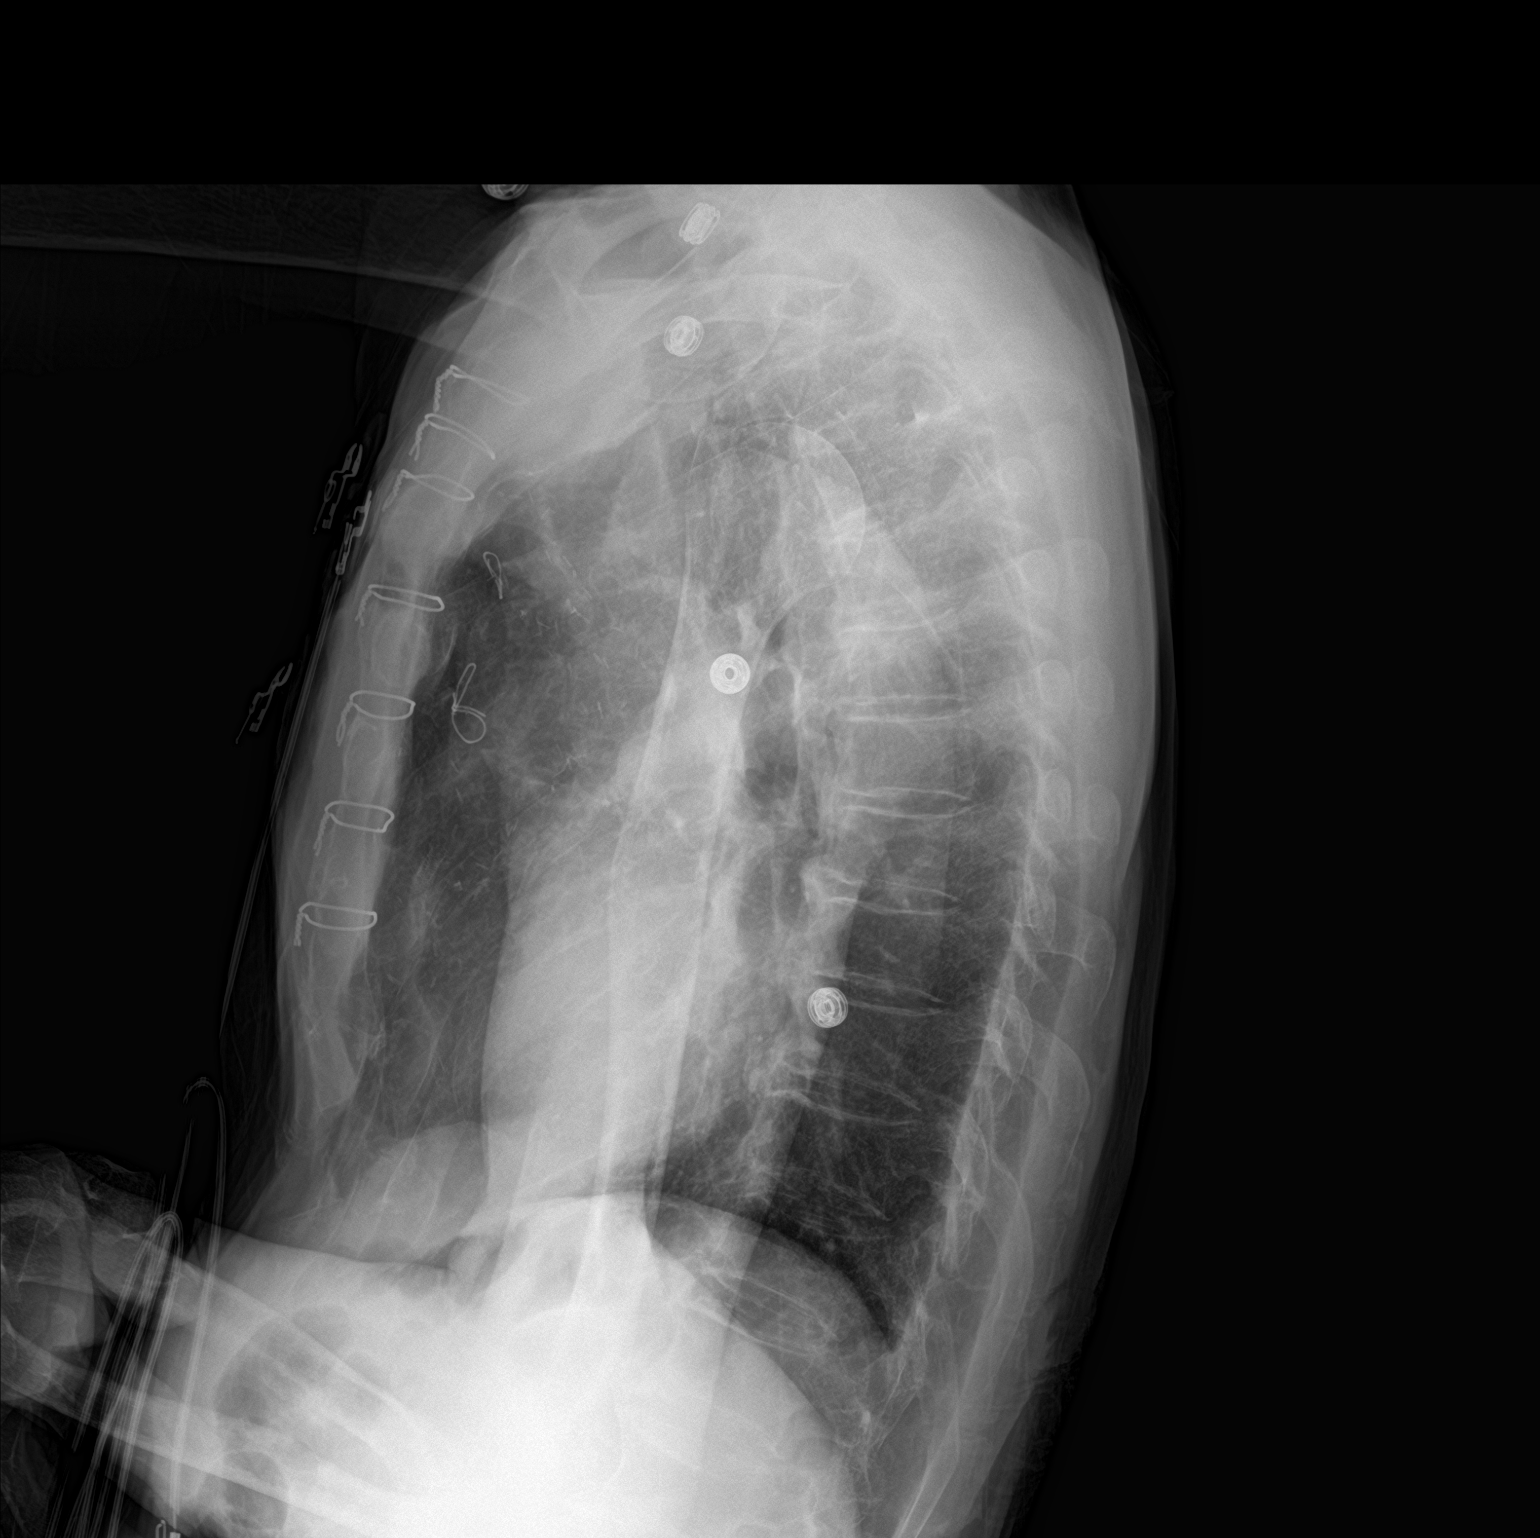

[2 of 2 positions shown; findings below may reference images not displayed]

FINDINGS: Sternotomy wires unchanged. Interval removal of left-sided
pacemaker. Lungs are adequately inflated without focal airspace
consolidation, effusion or pneumothorax. Cardiomediastinal
silhouette and remainder of the exam is unchanged.
IMPRESSION: No acute cardiopulmonary disease.

## 2020-01-12 ENCOUNTER — Ambulatory Visit: Payer: Medicare Other | Attending: Internal Medicine

## 2020-01-12 DIAGNOSIS — Z23 Encounter for immunization: Secondary | ICD-10-CM | POA: Insufficient documentation

## 2020-01-12 NOTE — Progress Notes (Signed)
   Covid-19 Vaccination Clinic  Name:  EULA MAZZOLA    MRN: [id number] DOB: 1935/05/06  01/12/2020  Mr. Raffo was observed post Covid-19 immunization for 15 minutes without incident. He was provided with Vaccine Information Sheet and instruction to access the V-Safe system.   Mr. Farquharson was instructed to call 911 with any severe reactions post vaccine: Marland Kitchen Difficulty breathing  . Swelling of face and throat  . A fast heartbeat  . A bad rash all over body  . Dizziness and weakness   Immunizations Administered    Name Date Dose VIS Date Route   Pfizer COVID-19 Vaccine 01/12/2020 12:36 PM 0.3 mL 10/23/2019 Intramuscular   Manufacturer: [employer], [employer]   Lot: [id number]   NDC: [address]-8

## 2020-02-03 ENCOUNTER — Ambulatory Visit (INDEPENDENT_AMBULATORY_CARE_PROVIDER_SITE_OTHER): Payer: Medicare Other | Admitting: *Deleted

## 2020-02-03 DIAGNOSIS — I429 Cardiomyopathy, unspecified: Secondary | ICD-10-CM

## 2020-02-03 DIAGNOSIS — I428 Other cardiomyopathies: Secondary | ICD-10-CM | POA: Diagnosis not present

## 2020-02-05 LAB — CUP PACEART REMOTE DEVICE CHECK
Battery Remaining Longevity: 131 mo
Battery Voltage: 3.01 V
Brady Statistic RV Percent Paced: 0.02 %
Date Time Interrogation Session: 20210325172726
HighPow Impedance: 77 Ohm
Implantable Lead Implant Date: 20191209
Implantable Lead Location: 753860
Implantable Pulse Generator Implant Date: 20191209
Lead Channel Impedance Value: 323 Ohm
Lead Channel Impedance Value: 437 Ohm
Lead Channel Pacing Threshold Amplitude: 0.75 V
Lead Channel Pacing Threshold Pulse Width: 0.4 ms
Lead Channel Sensing Intrinsic Amplitude: 9.625 mV
Lead Channel Sensing Intrinsic Amplitude: 9.625 mV
Lead Channel Setting Pacing Amplitude: 2 V
Lead Channel Setting Pacing Pulse Width: 0.4 ms
Lead Channel Setting Sensing Sensitivity: 0.3 mV

## 2020-03-21 ENCOUNTER — Other Ambulatory Visit: Payer: Medicare Other

## 2020-05-05 ENCOUNTER — Telehealth: Payer: Self-pay

## 2020-05-05 NOTE — Telephone Encounter (Signed)
Left message for patient to remind of missed remote transmission.  

## 2020-05-19 ENCOUNTER — Telehealth: Payer: Self-pay

## 2020-05-19 NOTE — Telephone Encounter (Signed)
The pt is out of the country and his daughter do not know when he will be back.

## 2021-12-27 ENCOUNTER — Telehealth: Payer: Self-pay

## 2021-12-27 NOTE — Telephone Encounter (Signed)
Called and daughter answered and all she said was patient was out of the country and she did not know when he would be back but she would call us when he gets back.
